# Patient Record
Sex: Male | Born: 1950 | Race: White | Hispanic: No | Marital: Married | Smoking: Former smoker
Health system: Southern US, Community
[De-identification: ages and names within clinical notes are randomized; demographics above are authoritative.]

## PROBLEM LIST (undated history)

## (undated) DIAGNOSIS — K222 Esophageal obstruction: Secondary | ICD-10-CM

## (undated) DIAGNOSIS — E785 Hyperlipidemia, unspecified: Secondary | ICD-10-CM

## (undated) DIAGNOSIS — K219 Gastro-esophageal reflux disease without esophagitis: Secondary | ICD-10-CM

## (undated) DIAGNOSIS — F1010 Alcohol abuse, uncomplicated: Secondary | ICD-10-CM

## (undated) DIAGNOSIS — I2510 Atherosclerotic heart disease of native coronary artery without angina pectoris: Secondary | ICD-10-CM

## (undated) HISTORY — DX: Gastro-esophageal reflux disease without esophagitis: K21.9

## (undated) HISTORY — PX: SPLENECTOMY, PARTIAL: SHX787

## (undated) HISTORY — DX: Atherosclerotic heart disease of native coronary artery without angina pectoris: I25.10

## (undated) HISTORY — PX: LAMINECTOMY: SHX219

## (undated) HISTORY — PX: INGUINAL HERNIA REPAIR: SUR1180

## (undated) HISTORY — DX: Esophageal obstruction: K22.2

## (undated) HISTORY — DX: Hyperlipidemia, unspecified: E78.5

## (undated) HISTORY — PX: HEMORROIDECTOMY: SUR656

---

## 2008-11-16 HISTORY — PX: CORONARY ARTERY BYPASS GRAFT: SHX141

## 2008-11-25 ENCOUNTER — Encounter: Payer: Self-pay | Admitting: Cardiology

## 2008-11-26 ENCOUNTER — Ambulatory Visit: Payer: Self-pay | Admitting: Cardiovascular Disease

## 2008-11-26 ENCOUNTER — Ambulatory Visit: Payer: Self-pay | Admitting: Cardiology

## 2008-11-26 ENCOUNTER — Inpatient Hospital Stay (HOSPITAL_COMMUNITY): Admission: AD | Admit: 2008-11-26 | Discharge: 2008-12-05 | Payer: Self-pay | Admitting: Cardiology

## 2008-11-27 ENCOUNTER — Ambulatory Visit: Payer: Self-pay | Admitting: Cardiovascular Disease

## 2008-11-27 ENCOUNTER — Encounter: Payer: Self-pay | Admitting: Thoracic Surgery (Cardiothoracic Vascular Surgery)

## 2008-11-27 ENCOUNTER — Encounter: Payer: Self-pay | Admitting: Cardiovascular Disease

## 2008-11-28 ENCOUNTER — Ambulatory Visit: Payer: Self-pay | Admitting: Thoracic Surgery (Cardiothoracic Vascular Surgery)

## 2008-12-02 ENCOUNTER — Encounter: Payer: Self-pay | Admitting: Cardiology

## 2008-12-03 ENCOUNTER — Encounter: Payer: Self-pay | Admitting: Cardiology

## 2008-12-04 ENCOUNTER — Encounter: Payer: Self-pay | Admitting: Cardiology

## 2008-12-21 ENCOUNTER — Ambulatory Visit: Payer: Self-pay | Admitting: Cardiology

## 2008-12-21 ENCOUNTER — Encounter: Payer: Self-pay | Admitting: Physician Assistant

## 2008-12-25 ENCOUNTER — Encounter
Admission: RE | Admit: 2008-12-25 | Discharge: 2008-12-25 | Payer: Self-pay | Admitting: Thoracic Surgery (Cardiothoracic Vascular Surgery)

## 2008-12-25 ENCOUNTER — Ambulatory Visit: Payer: Self-pay | Admitting: Thoracic Surgery (Cardiothoracic Vascular Surgery)

## 2009-01-26 ENCOUNTER — Ambulatory Visit: Payer: Self-pay | Admitting: Thoracic Surgery (Cardiothoracic Vascular Surgery)

## 2009-02-08 ENCOUNTER — Ambulatory Visit: Payer: Self-pay | Admitting: Thoracic Surgery (Cardiothoracic Vascular Surgery)

## 2009-02-16 ENCOUNTER — Encounter: Payer: Self-pay | Admitting: Cardiology

## 2009-02-22 ENCOUNTER — Encounter: Payer: Self-pay | Admitting: Cardiology

## 2009-03-29 ENCOUNTER — Telehealth: Payer: Self-pay | Admitting: Cardiology

## 2009-04-20 ENCOUNTER — Encounter (INDEPENDENT_AMBULATORY_CARE_PROVIDER_SITE_OTHER): Payer: Self-pay | Admitting: *Deleted

## 2009-05-10 DIAGNOSIS — I4510 Unspecified right bundle-branch block: Secondary | ICD-10-CM

## 2009-05-10 DIAGNOSIS — I10 Essential (primary) hypertension: Secondary | ICD-10-CM | POA: Insufficient documentation

## 2009-05-10 DIAGNOSIS — I2510 Atherosclerotic heart disease of native coronary artery without angina pectoris: Secondary | ICD-10-CM

## 2009-05-12 ENCOUNTER — Ambulatory Visit: Payer: Self-pay | Admitting: Cardiology

## 2009-05-12 DIAGNOSIS — E782 Mixed hyperlipidemia: Secondary | ICD-10-CM | POA: Insufficient documentation

## 2009-07-27 ENCOUNTER — Encounter: Payer: Self-pay | Admitting: Cardiology

## 2009-12-18 IMAGING — CR DG CHEST 2V
2 series · 2 of 2 positions shown · non-contrast
Comparison: None

CLINICAL DATA: Myocardial infarction

CHEST - 2 VIEW

[w chest pa]
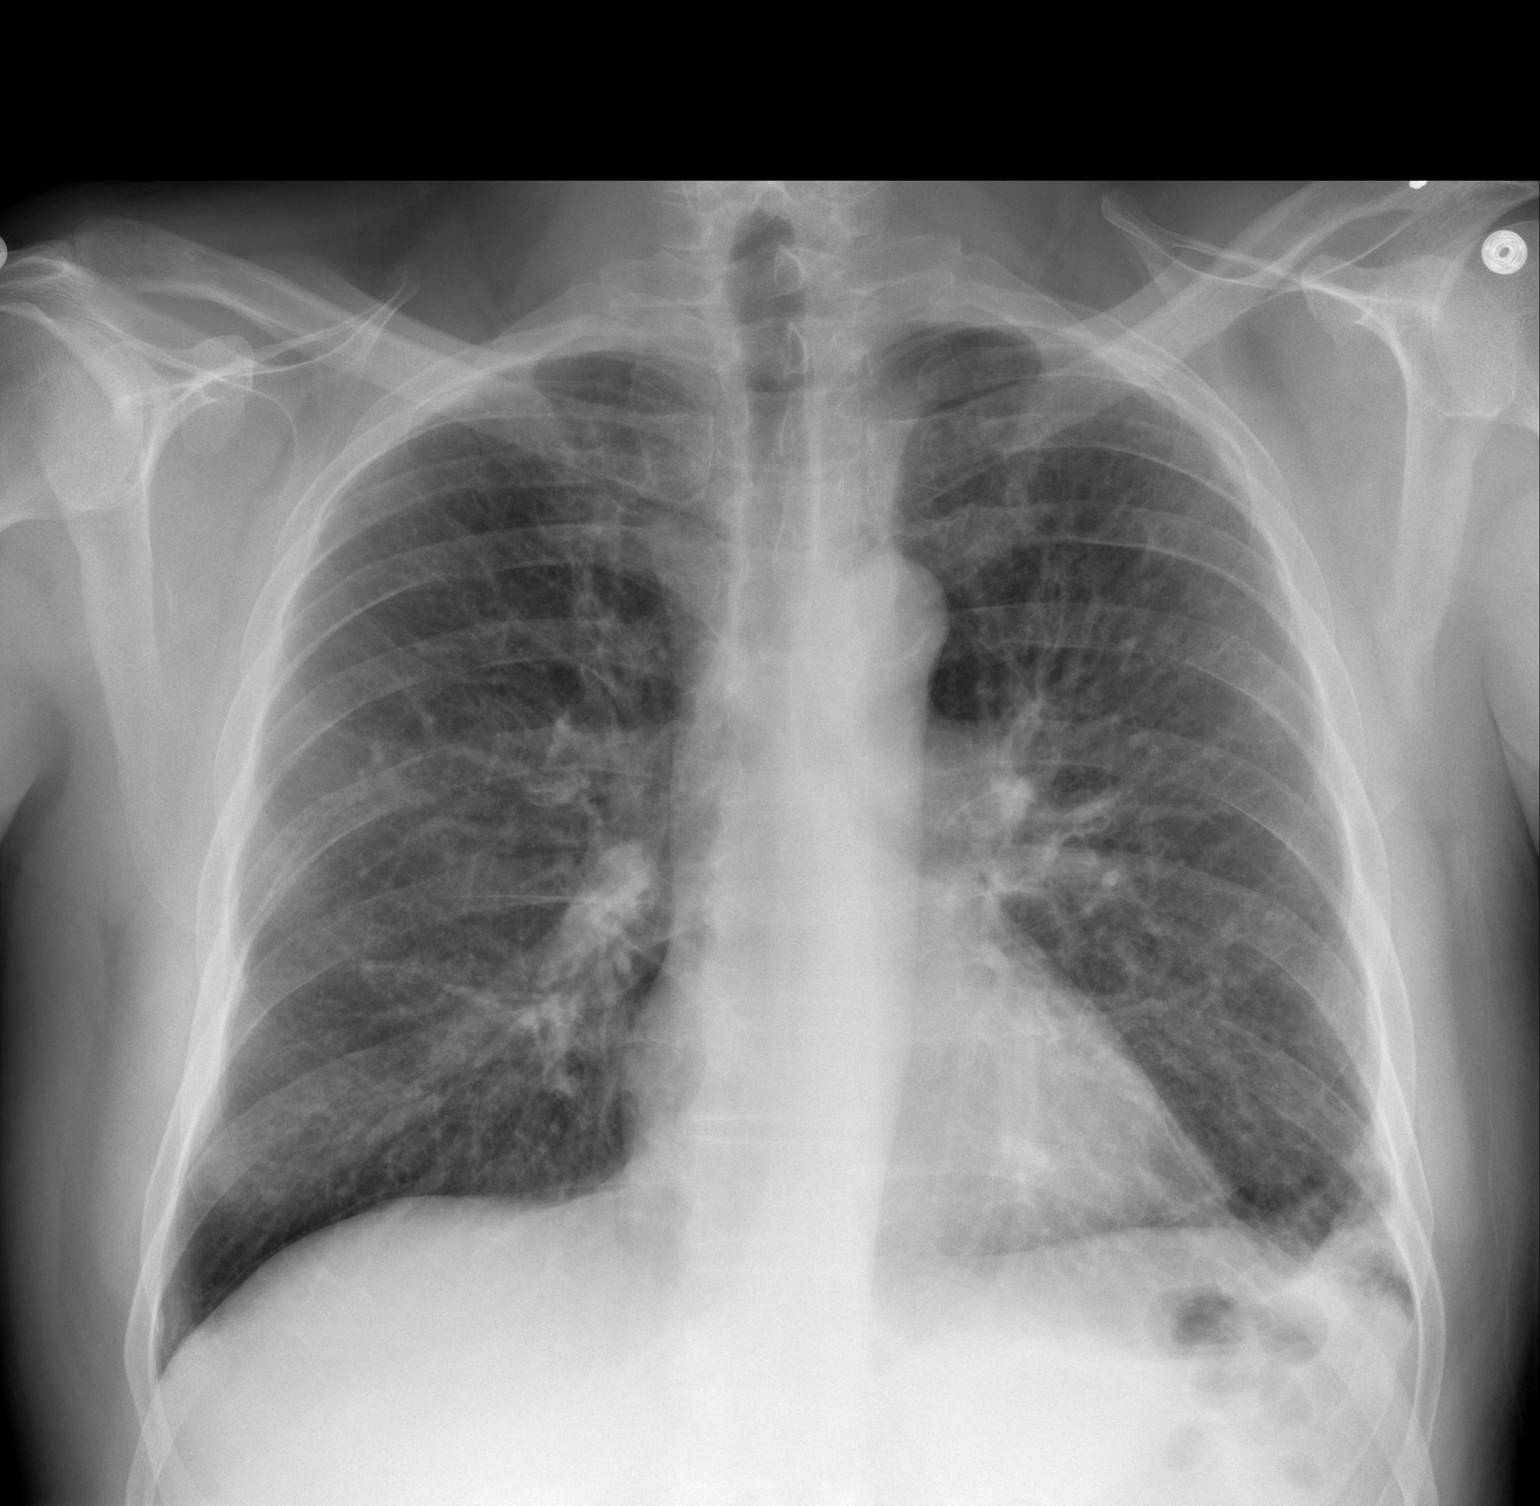

[w chest lat]
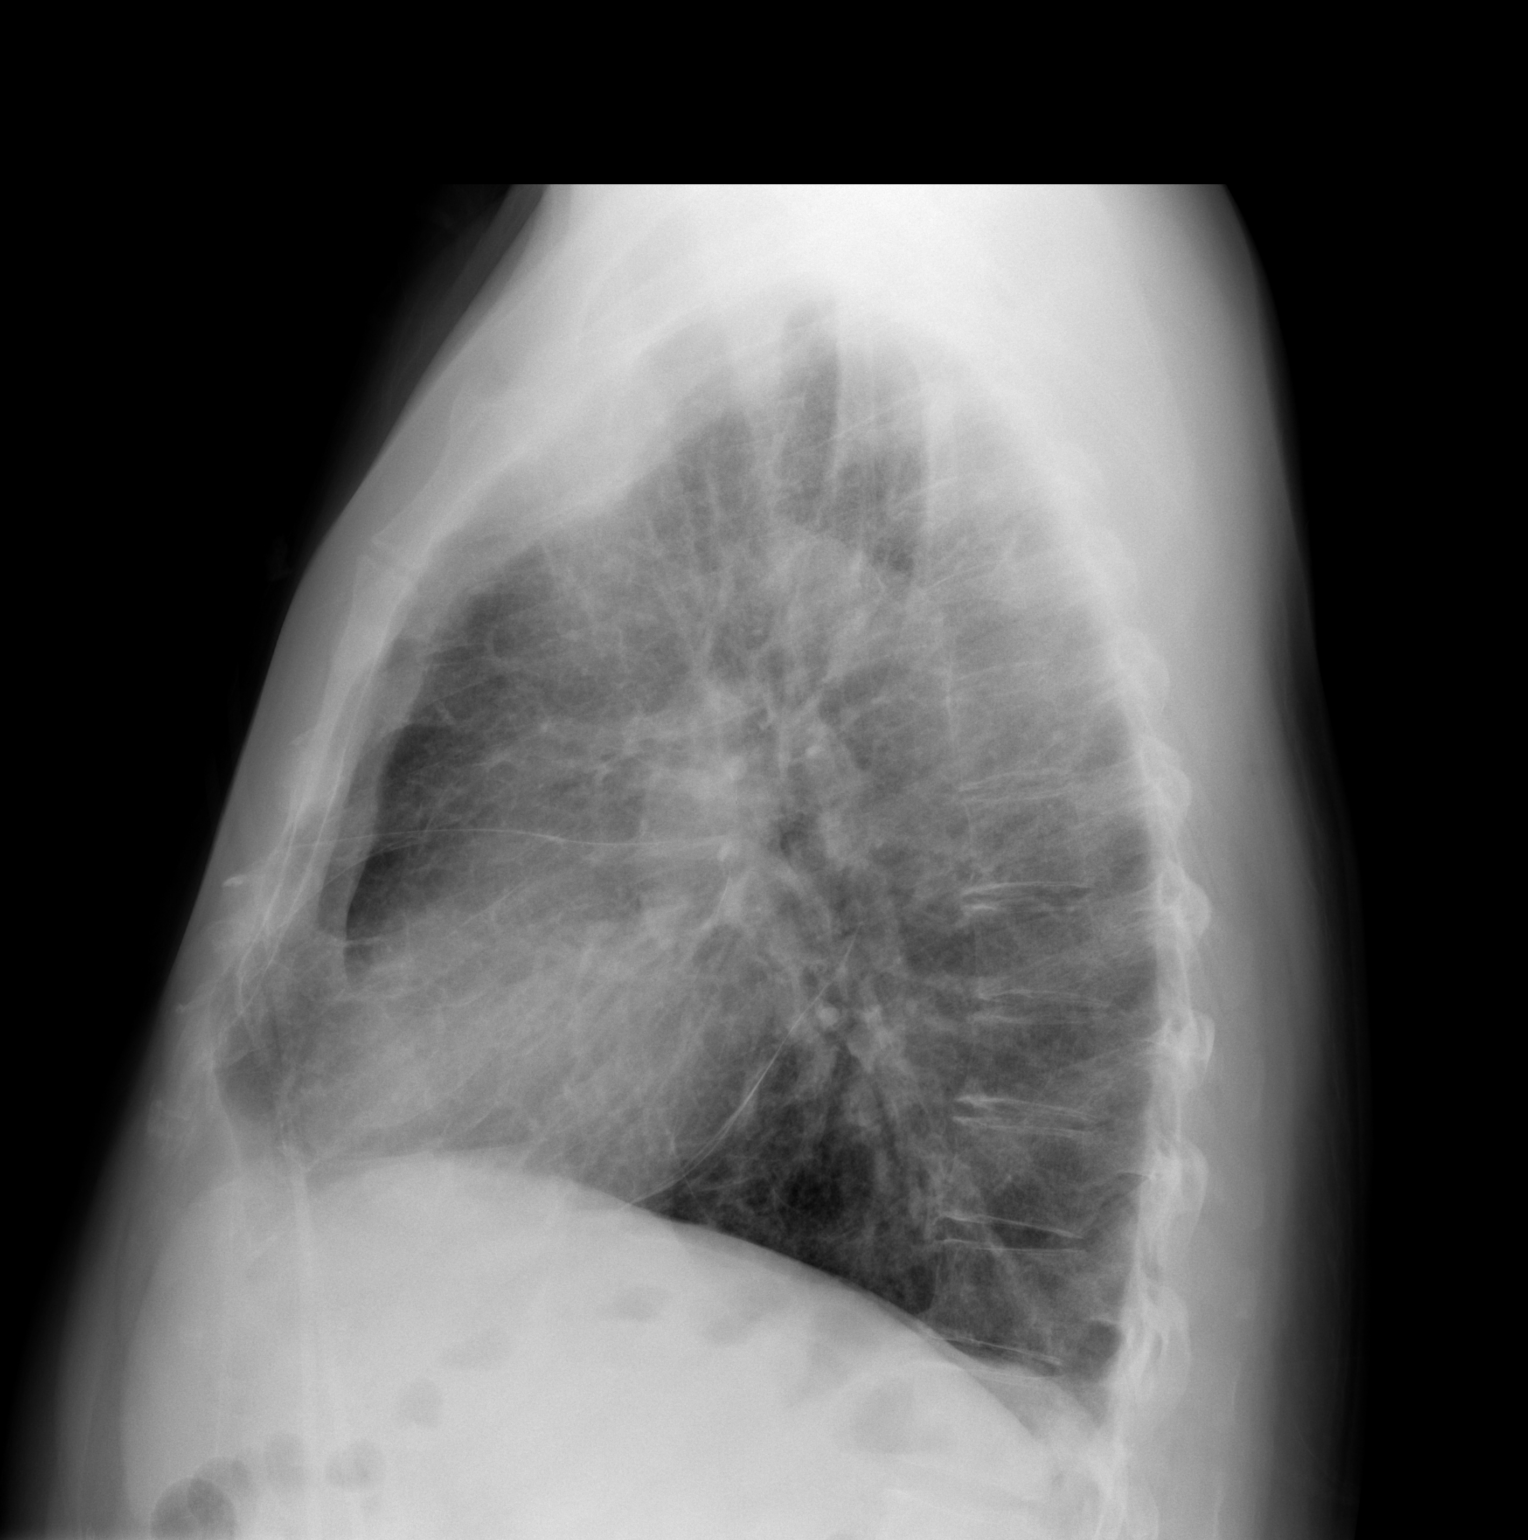

[2 of 2 positions shown; findings below may reference images not displayed]

FINDINGS: Minimal patchy atelectasis verses airspace disease at the
left base laterally.  No Kerley B lines.  Pulmonary vascularity is
within normal limits.  Bronchitic changes of chronic appearance.
The cardiomediastinal silhouette is within normal limits.  No
pneumothorax.
IMPRESSION: Minimal patchy atelectasis versus airspace disease at the left base
laterally.

## 2010-03-18 ENCOUNTER — Encounter (INDEPENDENT_AMBULATORY_CARE_PROVIDER_SITE_OTHER): Payer: Self-pay | Admitting: *Deleted

## 2010-03-18 ENCOUNTER — Encounter: Payer: Self-pay | Admitting: Cardiology

## 2010-03-28 ENCOUNTER — Ambulatory Visit: Payer: Self-pay | Admitting: Cardiology

## 2010-03-28 DIAGNOSIS — K439 Ventral hernia without obstruction or gangrene: Secondary | ICD-10-CM | POA: Insufficient documentation

## 2010-03-28 DIAGNOSIS — R609 Edema, unspecified: Secondary | ICD-10-CM | POA: Insufficient documentation

## 2010-04-12 ENCOUNTER — Ambulatory Visit: Payer: Self-pay | Admitting: Thoracic Surgery (Cardiothoracic Vascular Surgery)

## 2010-04-12 ENCOUNTER — Encounter: Payer: Self-pay | Admitting: Cardiology

## 2010-04-18 ENCOUNTER — Encounter: Payer: Self-pay | Admitting: Cardiology

## 2010-04-25 ENCOUNTER — Encounter: Payer: Self-pay | Admitting: Cardiology

## 2010-05-02 ENCOUNTER — Telehealth (INDEPENDENT_AMBULATORY_CARE_PROVIDER_SITE_OTHER): Payer: Self-pay | Admitting: *Deleted

## 2010-08-26 ENCOUNTER — Encounter: Payer: Self-pay | Admitting: Cardiology

## 2010-10-06 ENCOUNTER — Encounter: Payer: Self-pay | Admitting: Cardiology

## 2010-10-07 ENCOUNTER — Encounter: Payer: Self-pay | Admitting: Cardiology

## 2010-10-09 ENCOUNTER — Encounter: Payer: Self-pay | Admitting: Thoracic Surgery (Cardiothoracic Vascular Surgery)

## 2010-10-12 ENCOUNTER — Telehealth (INDEPENDENT_AMBULATORY_CARE_PROVIDER_SITE_OTHER): Payer: Self-pay | Admitting: *Deleted
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
## 2010-10-18 NOTE — Progress Notes (Signed)
Summary: Pt Call  Phone Note Call from Patient Call back at Home Phone [phone]   Summary of Call: Pt left message on voicemail asking for return call regarding Lipid labs.    Left message to call back on machine. Initial call taken by: Cyril Loosen, RN, BSN,  May 02, 2010 12:36 PM  Follow-up for Phone Call        Left message to call back on machine. Cyril Loosen, RN, BSN  May 05, 2010 2:08 PM  Left message asking pt to return call if he still has question regarding lipid labs. Follow-up by: Cyril Loosen, RN, BSN,  May 10, 2010 4:18 PM

## 2010-10-18 NOTE — Letter (Signed)
Summary: [facility]   [facility]   Imported By: Roderic Ovens 04/27/2010 14:59:40  _____________________________________________________________________  External Attachment:    Type:   Image     Comment:   External Document

## 2010-10-18 NOTE — Miscellaneous (Signed)
Summary: Orders Update  Clinical Lists Changes  Orders: Added new Test order of T-Lipid Profile ([id number]) - Signed Added new Test order of T-Hepatic Function ([id number]) - Signed 

## 2010-10-20 NOTE — Progress Notes (Signed)
Summary: Pt Requesting Order for Labs  ---- Converted from flag ---- ---- 10/06/2010 8:59 AM, Zachary George wrote: Has appt on 11-16-2010 with Dr. Reita Chard lab orders and please mail a copy of order to patient ------------------------------  Left message for pt to call back on voicemail regarding what lab work pt feels needs to be done. He had abn liver function labs and was asked to see his GI MD (Dr. Aleene Davidson) for further evaluation.   Appended Document: Pt Requesting Order for Labs Pt states he saw his GI MD who restarted Crestor after liver function labs came down. Pt asked to bring copy of labs and last office note from Dr. Vangie Bicker office. Pt verbalized understanding.

## 2010-11-16 ENCOUNTER — Ambulatory Visit (INDEPENDENT_AMBULATORY_CARE_PROVIDER_SITE_OTHER): Payer: BC Managed Care – PPO | Admitting: Cardiology

## 2010-11-16 ENCOUNTER — Encounter: Payer: Self-pay | Admitting: Cardiology

## 2010-11-16 DIAGNOSIS — E782 Mixed hyperlipidemia: Secondary | ICD-10-CM

## 2010-11-16 DIAGNOSIS — J984 Other disorders of lung: Secondary | ICD-10-CM | POA: Insufficient documentation

## 2010-11-16 DIAGNOSIS — R9389 Abnormal findings on diagnostic imaging of other specified body structures: Secondary | ICD-10-CM

## 2010-11-16 DIAGNOSIS — I2510 Atherosclerotic heart disease of native coronary artery without angina pectoris: Secondary | ICD-10-CM

## 2010-11-16 DIAGNOSIS — R609 Edema, unspecified: Secondary | ICD-10-CM
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
## 2010-11-28 ENCOUNTER — Encounter: Payer: Self-pay | Admitting: Cardiology

## 2010-12-06 ENCOUNTER — Telehealth: Payer: Self-pay | Admitting: *Deleted

## 2010-12-06 ENCOUNTER — Encounter: Payer: Self-pay | Admitting: *Deleted

## 2010-12-06 DIAGNOSIS — E782 Mixed hyperlipidemia: Secondary | ICD-10-CM

## 2010-12-06 DIAGNOSIS — Z79899 Other long term (current) drug therapy: Secondary | ICD-10-CM

## 2010-12-06 MED ORDER — ROSUVASTATIN CALCIUM 5 MG PO TABS: 5.0000 mg | ORAL_TABLET | Freq: Every day | ORAL | Status: DC

## 2010-12-06 NOTE — Telephone Encounter (Signed)
Pt's wife returned call in reference to pt's lab results. 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 He wanted her to call us for results. Pt's wife notified of results to decrease Crestor to 5mg  every night (start new prescription) and repeat cholesterol labs in 8 weeks. Pt's wife verbalized understanding.

## 2010-12-29 LAB — GLUCOSE, CAPILLARY
Glucose-Capillary: 101 mg/dL — ABNORMAL HIGH (ref 70–99)
Glucose-Capillary: 106 mg/dL — ABNORMAL HIGH (ref 70–99)
Glucose-Capillary: 112 mg/dL — ABNORMAL HIGH (ref 70–99)
Glucose-Capillary: 119 mg/dL — ABNORMAL HIGH (ref 70–99)
Glucose-Capillary: 122 mg/dL — ABNORMAL HIGH (ref 70–99)
Glucose-Capillary: 134 mg/dL — ABNORMAL HIGH (ref 70–99)
Glucose-Capillary: 135 mg/dL — ABNORMAL HIGH (ref 70–99)
Glucose-Capillary: 153 mg/dL — ABNORMAL HIGH (ref 70–99)
Glucose-Capillary: 69 mg/dL — ABNORMAL LOW (ref 70–99)
Glucose-Capillary: 88 mg/dL (ref 70–99)

## 2010-12-29 LAB — POCT I-STAT, CHEM 8
BUN: 4 mg/dL — ABNORMAL LOW (ref 6–23)
Creatinine, Ser: 0.7 mg/dL (ref 0.4–1.5)
Glucose, Bld: 110 mg/dL — ABNORMAL HIGH (ref 70–99)
Hemoglobin: 11.6 g/dL — ABNORMAL LOW (ref 13.0–17.0)
Potassium: 4.4 mEq/L (ref 3.5–5.1)
Sodium: 139 mEq/L (ref 135–145)

## 2010-12-29 LAB — CBC
HCT: 33.7 % — ABNORMAL LOW (ref 39.0–52.0)
HCT: 38.2 % — ABNORMAL LOW (ref 39.0–52.0)
HCT: 43.4 % (ref 39.0–52.0)
HCT: 44 % (ref 39.0–52.0)
HCT: 44.2 % (ref 39.0–52.0)
Hemoglobin: 12.3 g/dL — ABNORMAL LOW (ref 13.0–17.0)
Hemoglobin: 14.8 g/dL (ref 13.0–17.0)
Hemoglobin: 15.2 g/dL (ref 13.0–17.0)
Hemoglobin: 16 g/dL (ref 13.0–17.0)
MCHC: 34 g/dL (ref 30.0–36.0)
MCHC: 35.1 g/dL (ref 30.0–36.0)
MCHC: 34.6 g/dL (ref 30.0–36.0)
MCV: 101.1 fL — ABNORMAL HIGH (ref 78.0–100.0)
MCV: 101.2 fL — ABNORMAL HIGH (ref 78.0–100.0)
MCV: 99.7 fL (ref 78.0–100.0)
MCV: 100.6 fL — ABNORMAL HIGH (ref 78.0–100.0)
MCV: 101 fL — ABNORMAL HIGH (ref 78.0–100.0)
MCV: 101.5 fL — ABNORMAL HIGH (ref 78.0–100.0)
MCV: 99.4 fL (ref 78.0–100.0)
Platelets: 138 10*3/uL — ABNORMAL LOW (ref 150–400)
Platelets: 141 10*3/uL — ABNORMAL LOW (ref 150–400)
RBC: 3.37 MIL/uL — ABNORMAL LOW (ref 4.22–5.81)
RBC: 3.78 MIL/uL — ABNORMAL LOW (ref 4.22–5.81)
RBC: 4.24 MIL/uL (ref 4.22–5.81)
RBC: 4.31 MIL/uL (ref 4.22–5.81)
RBC: 4.35 MIL/uL (ref 4.22–5.81)
RBC: 4.45 MIL/uL (ref 4.22–5.81)
RBC: 4.58 MIL/uL (ref 4.22–5.81)
RDW: 13.5 % (ref 11.5–15.5)
RDW: 13.7 % (ref 11.5–15.5)
RDW: 13.9 % (ref 11.5–15.5)
RDW: 13.4 % (ref 11.5–15.5)
WBC: 10.4 10*3/uL (ref 4.0–10.5)
WBC: 16.5 10*3/uL — ABNORMAL HIGH (ref 4.0–10.5)
WBC: 19.9 10*3/uL — ABNORMAL HIGH (ref 4.0–10.5)
WBC: 11.5 10*3/uL — ABNORMAL HIGH (ref 4.0–10.5)
WBC: 11.9 10*3/uL — ABNORMAL HIGH (ref 4.0–10.5)
WBC: 14.6 10*3/uL — ABNORMAL HIGH (ref 4.0–10.5)

## 2010-12-29 LAB — POCT I-STAT 4, (NA,K, GLUC, HGB,HCT)
Glucose, Bld: 114 mg/dL — ABNORMAL HIGH (ref 70–99)
Glucose, Bld: 120 mg/dL — ABNORMAL HIGH (ref 70–99)
HCT: 31 % — ABNORMAL LOW (ref 39.0–52.0)
HCT: 33 % — ABNORMAL LOW (ref 39.0–52.0)
HCT: 45 % (ref 39.0–52.0)
Hemoglobin: 11.2 g/dL — ABNORMAL LOW (ref 13.0–17.0)
Hemoglobin: 12.9 g/dL — ABNORMAL LOW (ref 13.0–17.0)
Hemoglobin: 15.3 g/dL (ref 13.0–17.0)
Potassium: 3.9 mEq/L (ref 3.5–5.1)
Potassium: 5.3 mEq/L — ABNORMAL HIGH (ref 3.5–5.1)
Sodium: 131 mEq/L — ABNORMAL LOW (ref 135–145)
Sodium: 139 mEq/L (ref 135–145)
Sodium: 139 mEq/L (ref 135–145)

## 2010-12-29 LAB — COMPREHENSIVE METABOLIC PANEL
ALT: 24 U/L (ref 0–53)
Alkaline Phosphatase: 50 U/L (ref 39–117)
BUN: 6 mg/dL (ref 6–23)
CO2: 24 mEq/L (ref 19–32)
Chloride: 106 mEq/L (ref 96–112)
Glucose, Bld: 103 mg/dL — ABNORMAL HIGH (ref 70–99)
Potassium: 3.8 mEq/L (ref 3.5–5.1)
Sodium: 135 mEq/L (ref 135–145)
Total Bilirubin: 0.8 mg/dL (ref 0.3–1.2)
Total Protein: 6.3 g/dL (ref 6.0–8.3)

## 2010-12-29 LAB — POCT I-STAT 3, ART BLOOD GAS (G3+)
Acid-base deficit: 1 mmol/L (ref 0.0–2.0)
Acid-base deficit: 5 mmol/L — ABNORMAL HIGH (ref 0.0–2.0)
Bicarbonate: 23.8 mEq/L (ref 20.0–24.0)
Bicarbonate: 25.1 mEq/L — ABNORMAL HIGH (ref 20.0–24.0)
Bicarbonate: 26 mEq/L — ABNORMAL HIGH (ref 20.0–24.0)
O2 Saturation: 100 %
O2 Saturation: 100 %
O2 Saturation: 93 %
Patient temperature: 36.5
TCO2: 26 mmol/L (ref 0–100)
TCO2: 26 mmol/L (ref 0–100)
TCO2: 28 mmol/L (ref 0–100)
pCO2 arterial: 41.1 mmHg (ref 35.0–45.0)
pCO2 arterial: 44 mmHg (ref 35.0–45.0)
pCO2 arterial: 45.9 mmHg — ABNORMAL HIGH (ref 35.0–45.0)
pH, Arterial: 7.361 (ref 7.350–7.450)
pH, Arterial: 7.383 (ref 7.350–7.450)
pH, Arterial: 7.448 (ref 7.350–7.450)
pO2, Arterial: 211 mmHg — ABNORMAL HIGH (ref 80.0–100.0)
pO2, Arterial: 326 mmHg — ABNORMAL HIGH (ref 80.0–100.0)
pO2, Arterial: 61 mmHg — ABNORMAL LOW (ref 80.0–100.0)

## 2010-12-29 LAB — MAGNESIUM: Magnesium: 2.3 mg/dL (ref 1.5–2.5)

## 2010-12-29 LAB — URINALYSIS, ROUTINE W REFLEX MICROSCOPIC
Bilirubin Urine: NEGATIVE
Hgb urine dipstick: NEGATIVE
Ketones, ur: NEGATIVE mg/dL
Specific Gravity, Urine: 1.008 (ref 1.005–1.030)
Urobilinogen, UA: 0.2 mg/dL (ref 0.0–1.0)
pH: 6.5 (ref 5.0–8.0)

## 2010-12-29 LAB — BLOOD GAS, ARTERIAL
Acid-base deficit: 1.7 mmol/L (ref 0.0–2.0)
Bicarbonate: 21.9 mEq/L (ref 20.0–24.0)
O2 Saturation: 92.9 %
Patient temperature: 98.6
TCO2: 22.9 mmol/L (ref 0–100)
pO2, Arterial: 66.5 mmHg — ABNORMAL LOW (ref 80.0–100.0)

## 2010-12-29 LAB — BASIC METABOLIC PANEL
BUN: 7 mg/dL (ref 6–23)
BUN: 8 mg/dL (ref 6–23)
BUN: 6 mg/dL (ref 6–23)
BUN: 6 mg/dL (ref 6–23)
CO2: 21 mEq/L (ref 19–32)
CO2: 24 mEq/L (ref 19–32)
Calcium: 8.7 mg/dL (ref 8.4–10.5)
Calcium: 8.8 mg/dL (ref 8.4–10.5)
Calcium: 8.8 mg/dL (ref 8.4–10.5)
Calcium: 8.9 mg/dL (ref 8.4–10.5)
Chloride: 111 mEq/L (ref 96–112)
Chloride: 105 mEq/L (ref 96–112)
Chloride: 106 mEq/L (ref 96–112)
Creatinine, Ser: 0.8 mg/dL (ref 0.4–1.5)
Creatinine, Ser: 0.88 mg/dL (ref 0.4–1.5)
Creatinine, Ser: 0.82 mg/dL (ref 0.4–1.5)
GFR calc Af Amer: >60 mL/min (ref >60)
GFR calc Af Amer: >60 mL/min (ref >60)
GFR calc non Af Amer: >60 mL/min (ref >60)
GFR calc non Af Amer: >60 mL/min (ref >60)
GFR calc non Af Amer: >60 mL/min (ref >60)
Glucose, Bld: 102 mg/dL — ABNORMAL HIGH (ref 70–99)
Glucose, Bld: 91 mg/dL (ref 70–99)
Glucose, Bld: 95 mg/dL (ref 70–99)
Potassium: 3.5 mEq/L (ref 3.5–5.1)
Potassium: 4.8 mEq/L (ref 3.5–5.1)
Sodium: 135 mEq/L (ref 135–145)
Sodium: 138 mEq/L (ref 135–145)

## 2010-12-29 LAB — CARDIAC PANEL(CRET KIN+CKTOT+MB+TROPI)
Relative Index: 3.5 — ABNORMAL HIGH (ref 0.0–2.5)
Relative Index: 3.9 — ABNORMAL HIGH (ref 0.0–2.5)
Total CK: 204 U/L (ref 7–232)
Troponin I: 0.78 ng/mL (ref 0.00–0.06)
Troponin I: 0.94 ng/mL (ref 0.00–0.06)

## 2010-12-29 LAB — POCT I-STAT GLUCOSE: Glucose, Bld: 116 mg/dL — ABNORMAL HIGH (ref 70–99)

## 2010-12-29 LAB — POCT I-STAT 3, VENOUS BLOOD GAS (G3P V)
O2 Saturation: 88 %
TCO2: 24 mmol/L (ref 0–100)

## 2010-12-29 LAB — HEPARIN LEVEL (UNFRACTIONATED): Heparin Unfractionated: 0.21 IU/mL — ABNORMAL LOW (ref 0.30–0.70)

## 2010-12-29 LAB — PROTIME-INR
Prothrombin Time: 16.6 seconds — ABNORMAL HIGH (ref 11.6–15.2)
Prothrombin Time: 13.8 seconds (ref 11.6–15.2)

## 2010-12-29 LAB — HEMOGLOBIN AND HEMATOCRIT, BLOOD
HCT: 32.2 % — ABNORMAL LOW (ref 39.0–52.0)
Hemoglobin: 11.2 g/dL — ABNORMAL LOW (ref 13.0–17.0)

## 2010-12-29 LAB — DIFFERENTIAL
Lymphs Abs: 3.3 10*3/uL (ref 0.7–4.0)
Monocytes Absolute: 1.3 10*3/uL — ABNORMAL HIGH (ref 0.1–1.0)
Monocytes Relative: 11 % (ref 3–12)
Neutro Abs: 7 10*3/uL (ref 1.7–7.7)
Neutrophils Relative %: 59 % (ref 43–77)

## 2010-12-29 LAB — APTT: aPTT: 38 seconds — ABNORMAL HIGH (ref 24–37)

## 2010-12-29 LAB — CREATININE, SERUM
Creatinine, Ser: 0.71 mg/dL (ref 0.4–1.5)
GFR calc Af Amer: >60 mL/min (ref >60)

## 2010-12-29 LAB — TSH: TSH: 1.127 u[IU]/mL (ref 0.350–4.500)

## 2010-12-29 LAB — TYPE AND SCREEN: Antibody Screen: NEGATIVE

## 2011-01-31 ENCOUNTER — Telehealth: Payer: Self-pay | Admitting: *Deleted

## 2011-01-31 ENCOUNTER — Encounter: Payer: Self-pay | Admitting: Cardiology

## 2011-01-31 NOTE — Telephone Encounter (Signed)
Message copied by Cyril Loosen on Tue Jan 31, 2011  3:54 PM ------      Message from: Nona Dell      Created: Tue Jan 31, 2011 12:36 PM       AST and ALT are essentially high normal, better than last checked. LDL is reasonable at 85. Will try to continue Crestor 5 mg daily for now. Still should be careful about alcohol intake.

## 2011-01-31 NOTE — Assessment & Plan Note (Signed)
OFFICE VISIT   [name], [name]  DOB:  07/28/1951                                        December 25, 2008  CHART #:  [id number]   The patient is a 60 year old gentleman, who underwent coronary artery  bypass grafting x6.  We used a left mammary and a left radial as well as  some saphenous vein on December 01, 2008.  Postoperatively did well,  discharged home without any complications.  He has continued to do well.  He is not taking any pain medications.  His exercise tolerance is good.  He is anxious to increase his physical activity and he is also anxious  to start cardiac rehab.   PHYSICAL EXAMINATION:  GENERAL:  The patient is a well-appearing 57-year-  old white male in no acute distress.  VITAL SIGNS:  His blood pressure is 116/76, pulse 78, respirations are  20, and oxygen saturation is 97% on room air.  LUNGS:  Clear with equal breath sounds bilaterally.  CARDIAC:  Regular rate and rhythm.  Normal S1 and S2.  No murmurs, rubs,  or gallops.  CHEST:  His sternum is stable.  Sternal incision is well healed.  EXTREMITIES:  He has no peripheral edema.  His arm incision has some  minimal eschar, but no signs of infection.  He did have an infection and  has resolving cellulitis at his right ankle incision.   Chest x-ray shows good aeration of the lungs bilaterally.  There is no  effusions or infiltrates.   IMPRESSION:  The patient is a 60 year old gentleman, who is doing  extremely well after coronary artery bypass grafting.  He did have some  cellulitis of his right leg incision at the ankle.  He was treated with  antibiotics with Keflex by Dr. Diona Browner and that is resolving.  Otherwise, he has had no complications.  He is doing quite well.  His  exercise tolerance is good.  He has minimal discomfort.  I encouraged  him to do the cardiac rehab program which he is planning to do.  He is  not to lift any heavy objects or mow his yard for another 3 weeks.  He  may begin driving on a limited basis.  Appropriate precautions were  discussed.  We will set a tentative return to work date for March 22, 2009, for the patient.  He will continue to be followed by Dr. Diona Browner  and Dr. Doyne Keel.  I would be happy to see him back anytime if I can be  of any further assistance with his care.   Salvatore Decent Dorris Fetch, M.D.  Electronically Signed   SCH/MEDQ  D:  12/25/2008  T:  12/26/2008  Job:  [id number]   cc:   Jonelle Sidle, MD  Wende Crease, MD

## 2011-01-31 NOTE — Telephone Encounter (Signed)
Left message to call back on voicemail regarding lab results. 
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
## 2011-01-31 NOTE — Assessment & Plan Note (Signed)
[facility]                          [facility] OFFICE NOTE   NAME:Brester, JHAN CONERY                      MRN:          [id number]  DATE:12/21/2008                            DOB:          Nov 10, 1950    PRIMARY CARDIOLOGIST:  Jonelle Sidle, MD   REASON FOR VISIT:  Post-hospital followup.   Mr. [name] presents to our clinic for his initial visit, following  recent coronary artery bypass grafting at [facility].  The  patient was referred to [place] initially here at [facility] on November 26, 2008, when he presented with non-ST-elevation myocardial infarction.  He had no prior history of heart disease, but several cardiac risk  factors, and presented with abnormal cardiac markers with a troponin of  1.2.  No acute changes were noted on electrocardiogram, which was  notable for a right bundle-branch block, but with no old EKGs for  comparison.   Prior to transfer, the patient was treated with a 300 mg dose of Plavix.   Cardiac catheterization revealed severe native coronary artery disease,  including 80% distal stenosis of the left main coronary artery.  Left  ventricular function was preserved.  He was referred for surgical  revascularization, which was successfully performed, by Dr. Charlett Lango, on December 01, 2008, with grafting of the LIMA-LAD;  left  renal artery-OM2-OM3; SVG-GX1; SVG PD-PL.   No significant postop developments were noted, particularly for  dysrhythmia.   Clinically, Mr. Shakespeare has done quite well since undergoing surgery.  He  is ambulating twice daily, with no symptoms suggestive of unstable  angina pectoris.  He has quit smoking tobacco.  He is tolerating his  medications.  His wife is concerned, however, of possible infection of  the right lower extremity incision, indicating that there were some  pus appearing lesions in the recent past.   Of note, the patient is also anxious to return to work,  as well as  resume driving.   EKG today indicates NSR at 90 bpm with chronic right bundle-branch  block.   CURRENT MEDICATIONS:  1. Full-dose aspirin.  2. Lopressor 25 b.i.d.  3. Isosorbide mononitrate 15 daily.  4. Crestor 40 daily.   PHYSICAL EXAMINATION:  VITAL SIGNS:  Blood pressure 115/75, pulse 92,  regular, and weight 118.  General:  A 60 year old male sitting upright in no distress.  NECK:  No JVD.  LUNGS:  Clear to auscultation in all fields.  HEART:  Regular rate and rhythm.  No significant murmurs.  No rubs.  ABDOMEN:  Soft, nontender.  EXTREMITIES:  Trace edema with mild peri-incisional erythema in the  distal right lower leg.  The incision itself is closed, and there is no  obvious drainage.  There were also no obvious pus-like lesions.  NEUROLOGIC:  No focal deficit.   IMPRESSION:  1. Multivessel coronary artery disease.      a.     Status post non-ST segment elevation myocardial infarction.      b.     Status post Six-vessel coronary artery bypass grafting,       including  left radial artery graft.      c.     Preserved left ventricular function.  2. Hypertension.  3. Tobacco, since discontinued.  4. Chronic right bundle-branch block.  5. Question right lower extremity wound infection.   PLAN:  1. Adjust medications with up titration of Lopressor to 50 b.i.d., for      better control of elevated basal heart rate.  Down titrate aspirin      to 81 mg daily.  2. Reassess lipid status with a followup fasting lipid/liver profile      in approximately 8 weeks.  Recent data indicated dyslipidemia with      an LDL of 113, HDL of 23, total cholesterol of 159, and      triglycerides of 117.  3. Prescribed Keflex 250 mg p.o. q.i.d. (x10 days), for treatment of      possible infection of the distal right lower extremity incision      site.  Further recommendations will be deferred to Dr. Charlett Lango, with whom the patient is scheduled to follow at the       end of this week.  4. Schedule return clinic follow up with myself and Dr. Diona Browner in      approximately 3 months.  The patient is encouraged to participate      in cardiac rehab, once cleared from a surgical standpoint.      Rozell Searing, PA-C  Electronically Signed      Jonelle Sidle, MD  Electronically Signed   GS/MedQ  DD: 12/21/2008  DT: 12/22/2008  Job #: [id number]   cc:   Myrlene Broker, MD in [place], [place].

## 2011-01-31 NOTE — Assessment & Plan Note (Signed)
OFFICE VISIT   ASHAWN, RINEHART  DOB:  1951/02/01                                        April 12, 2010  CHART #:  [id number]   HISTORY:  The patient is a 60 year old gentleman who had a coronary  bypass grafting in March 2010.  We did 6 bypasses on him using a left  mammary, left radial as well as saphenous vein.  He did well  postoperatively.  He has continued to do well from a cardiac standpoint.  About 2 months ago, he noticed a bulge, right at the lower portion of  his sternal incision.  He said that this had not been there previously.  He noticed that it pokes out at times, but is soft.  He has never had a  sensation that there was a hard mass within it and that would not  reduce.   PAST MEDICAL HISTORY:  Significant for coronary artery disease status  post coronary artery bypass grafting, esophageal strictures requiring  dilatation, hypertension, gastroesophageal reflux, chronic constipation,  ruptured spleen, remote tobacco abuse, previous hemorrhoidectomy,  bilateral inguinal hernia repair, and lumbar laminectomy.   CURRENT MEDICATIONS:  1. Aspirin 325 mg daily.  2. Lopressor 50 mg b.i.d.  3. Lasix 20 mg daily.  4. Potassium 10 mEq daily.  5. He had been on Crestor, but that was discontinued.   ALLERGIES:  He has no known drug allergies.   FAMILY HISTORY:  Noncontributory.   SOCIAL HISTORY:  He quit smoking.  He still works with [employer] and do some lifting.   PHYSICAL EXAMINATION:  General:  The patient is a 60 year old gentleman,  in no acute distress.  Vital Signs:  Blood pressure is 137/92, pulse 80,  respirations are 18, and oxygen saturation is 93% on room air.  Lungs:  His sternum is stable.  Skin:  His skin incision is well healed  throughout.  At the lower portion of the incision, there is a small  hernia.  The margins of it are not particularly distinct.  The xiphoid  is easily palpable through that area.  There  is no evidence of  incarceration.   IMPRESSION:  The patient is a 60 year old gentleman.  He is about a year  and half out from coronary bypass grafting.  He has a small incisional  hernia at the inferior aspect of his sternal incision.  This is  minimally symptomatic and he was just more concerned that it could lead  to bigger problems down the road.  I discussed with him our options  would be to continue to follow this versus a surgical repair, which  would be straightforward procedure we done in day surgery type  situation, but he would be about 6 weeks without being able to do any  heavy lifting.  The patient does not wish to have any surgical  intervention at this time, but we will follow this.  He does know to  seek medical attention immediately if he were to develop any signs of  incarceration, I will discuss, in that area.  He also will call and set  up the time for repair if he notices more discomfort or any increase in  size in the area.  I will be happy to see him back at anytime.  If he  decides he wants  to go ahead and have the repair, I would be happy to do  so.   Salvatore Decent Dorris Fetch, M.D.  Electronically Signed   SCH/MEDQ  D:  04/12/2010  T:  04/13/2010  Job:  [id number]   cc:   Jonelle Sidle, MD  Dr. Wende Crease

## 2011-01-31 NOTE — Cardiovascular Report (Signed)
[name], BOATENG NO.:  [id number]   MEDICAL RECORD NO.:  [id number]          PATIENT TYPE:  INP   LOCATION:  2505                         FACILITY:  [facility]   PHYSICIAN:  Verne Carrow, MDDATE OF BIRTH:  Jan 01, 1951   DATE OF PROCEDURE:  11/26/2008  DATE OF DISCHARGE:                            CARDIAC CATHETERIZATION   PRIMARY CARDIOLOGIST:  Jonelle Sidle, MD   PROCEDURES PERFORMED:  1. Left heart catheterization.  2. Selective coronary angiography.  3. Left ventricular angiogram.   OPERATOR:  Verne Carrow, MD   INDICATIONS:  Non-ST-elevation myocardial infarction in a 60 year old  lifelong smoker with hypertension, hyperlipidemia, and a family history  of coronary artery disease.  The patient was transferred here from  [facility] this afternoon for a diagnostic left heart  catheterization.  He is currently chest pain free.   PROCEDURE IN DETAIL:  The patient was brought to the Inpatient Cardiac  Catheterization Laboratory after signing informed consent for the  procedure.  The right groin was prepped and draped in sterile fashion.  A 1% lidocaine was used for local anesthesia.  A 5-French sheath was  inserted into the right femoral artery without difficulty.  Standard  diagnostic catheters were used to perform selective coronary  angiography.  A 5-French pigtail catheter was used to perform a left  ventricular angiogram.  There was no significant pressure gradient  across the aortic valve during catheter pullback.  The patient tolerated  the procedure well and was taken to the holding area in stable  condition.   ANGIOGRAPHIC FINDINGS:  1. The left main coronary artery has a distal 70-80% stenosis.  2. Left anterior descending:  The left anterior descending artery has      a long segment of 50% stenosis.  There is a discrete 80% stenosis      in the mid LAD just beyond the second diagonal.  The distal LAD has      serial  40% stenoses.  First diagonal is a moderate-sized vessel      without severe stenosis.  The second diagonal is a small-to-      moderate-sized vessel without severe stenosis.  3. Circumflex:  The proximal circumflex has a 50% tubular stenosis.      There is a 60% stenosis in the midportion of this vessel just prior      to the takeoff of a large bifurcating third obtuse marginal branch.      The first and second obtuse marginal branches are small in caliber.      The third obtuse marginal branch has at least 50-60% stenosis in      the ostium.  There is diffuse disease down the remainder of this      large bifurcating third obtuse marginal branch.  4. Right coronary artery:  There is proximal 50% stenosis extending      from the ostium.  There is a 50% stenosis in the midportion of the      vessel.  There is a 99% stenosis in the distal portion of the      vessel.  The  posterior descending artery and the posterolateral      branch fill faintly from antegrade flow and also filled from left      collaterals.  5. Left ventricular angiogram performed in the RAO projection shows      mild hypokinesis of the inferior wall.  Overall, left ventricular      systolic function is well preserved.  There is no significant      mitral regurgitation noted.   HEMODYNAMIC DATA:  Central aortic pressure 112/68.  Left ventricular  pressure 101/11, end-diastolic pressure 21.   IMPRESSION:  1. Triple-vessel coronary artery disease.  2. Left main coronary artery disease.  3. Preserved left ventricular systolic function.   RECOMMENDATIONS:  After reviewing the patient's angiogram, I feel that  he would be best treated with a surgical revascularization.  We will  consult Cardiothoracic Surgery for opinion on surgical revascularization  in the morning.  This patient was loaded with 300 mg of Plavix earlier  this morning at [facility].  This would delay any surgical  revascularization for at least 4  days.  We will remove his sheath in the  holding area and will start a heparin drip 6 hours after the sheath was  removed.  We will continue his daily aspirin, beta-blocker, and statin  therapy.  His Plavix will be stopped pending surgical consultation.      Verne Carrow, MD  Electronically Signed     CM/MEDQ  D:  11/26/2008  T:  11/27/2008  Job:  [id number]

## 2011-01-31 NOTE — Op Note (Signed)
[name], Berger NO.:  [id number]   MEDICAL RECORD NO.:  [id number]          PATIENT TYPE:  INP   LOCATION:  2306                         FACILITY:  [facility]   PHYSICIAN:  Salvatore Decent. Dorris Fetch, M.D.DATE OF BIRTH:  March 18, 1951   DATE OF PROCEDURE:  12/01/2008  DATE OF DISCHARGE:                               OPERATIVE REPORT   PREOPERATIVE DIAGNOSIS:  Severe three-vessel coronary disease with non-Q  wave myocardial infarction.   POSTOPERATIVE DIAGNOSIS:  Severe three-vessel coronary disease with non-  Q wave myocardial infarction.   PROCEDURE:  Median sternotomy, extracorporeal circulation, coronary  artery bypass grafting x6 (left internal mammary artery to LAD,  sequential left radial artery to obtuse marginals 2 and 3, saphenous  vein graft to first diagonal, sequential saphenous vein graft to  posterior descending and posterolateral) open left radial harvest,  endoscopic harvest right thigh, open vein harvest right ankle.   SURGEON:  Salvatore Decent. Dorris Fetch, MD.   ASSISTANT:  Rowe Clack, PA-C.   SECOND ASSISTANT:  Coral Ceo, PA   ANESTHESIA:  General.   FINDINGS:  With the exception of LAD, small target vessels with good  quality conduits. Saphenous vein bifurcated in the lower thigh into  small unusable vessels.  Additional saphenous vein harvested in open  fashion from right ankle.   CLINICAL NOTE:  [name] is a 60 year old gentleman with multiple  cardiac risk factors who presented with unstable coronary syndrome and  enzymes were positive for a non-Q wave myocardial infarction.  At  cardiac catheterization, he had severe three-vessel coronary disease and  was referred for coronary artery bypass grafting.  He had been loaded  with Plavix.  Surgery was delayed to allow Plavix washout.  The patient  remained asymptomatic during that time.  The indications, risks,  benefits, and alternatives were discussed in detail with the patient.  The  risks had been previously outlined in the consultation note.  The  patient understood and accepted these risks and agreed to proceed.  We  did discuss left radial artery harvest, and the patient understood the  specific risks associated with that portion of the procedure.  He did  have a normal Allen test, and this was confirmed with pulse oximetry in  the preoperative holding area.   OPERATIVE NOTE:  [name] was brought to the preop holding area on  December 01, 2008.  There were lines placed by anesthesia for monitoring  arterial, central venous, and pulmonary arterial pressure.  Intravenous  antibiotics were administered.  The patient was taken to the operating  room, anesthetized, and intubated.  A Foley catheter was placed.  The  chest, abdomen, legs, and left arm were prepped and draped in the usual  fashion.  An incision was made over the volar aspect of the left wrist  overlying the radial pulse.  The fascia was incised exposing the radial  artery, a short segment was mobilized.  There was an excellent distal  pulse with proximal occlusion.  Incision then was extended to just below  the antecubital fossa, and the radial artery was harvested in standard  fashion using a harmonic scalpel.  Simultaneously, incision was made in  the medial aspect of the right leg at the level of the knee and the  greater saphenous vein was harvested endoscopically to the right groin.  The saphenous vein was of good quality; however, just above the knee,  the vein had bifurcated in both branches separately and were too small.  Additional vein was harvested from the right ankle in an open fashion.  This vein was satisfactory.  Simultaneously, a median sternotomy was  performed, and the left internal mammary artery was harvested using  standard technique.  Heparin 2000 units was administered during the  vessel harvest portion of procedure.  The remainder of the full heparin  dose was given prior to  opening the pericardium.   After the conduits had been harvested, the leg and arm incisions were  closed with a 3-0 Vicryl subcutaneous suture followed by a 3-0 Vicryl  subcuticular suture.  The left arm then was tucked to the patient's  side.  The pericardium was opened.  The ascending aorta was inspected.  There was no evidence of atherosclerotic disease.  The aorta was  cannulated via concentric 2-0 Ethibond pledgeted purse-string sutures.  A dual-stage venous cannula was placed via purse-string suture in right  atrial appendage.  Cardiopulmonary bypass was instituted, and the  patient was cooled to 32 degrees Celsius.  The coronary arteries were  inspected and anastomotic sites were chosen.  The conduits were  inspected and cut to length.  A foam pad was placed in the pericardium  to insulate the left phrenic nerve.  A temperature probe was placed in  myocardial septum, and a cardioplegic cannula was placed in the  ascending aorta.   The aorta was cross-clamped.  The left ventricle was emptied via the  aortic root vent.  Cardiac arrest then was achieved with combination of  cold antegrade blood cardioplegia and topical iced saline.  1.5 L of  cardioplegia was administered.  There was a rapid diastolic arrest and  myocardial septal cooling to 9 degrees Celsius.  The following distal  anastomoses were performed.   First, a reverse saphenous vein graft was placed sequentially to the  posterior descending and posterolateral branches of the right coronary.  There were multiple small branches in this distribution, and the 2  largest vessels were selected for grafting.  The posterior descending  accepted only a 1-mm probe distally, did accept a 1.5-mm probe  proximally.  A side-to-side anastomosis was performed to this vessel  with a running 7-0 Prolene suture.  All anastomoses were probed  proximally and distally at their completion to ensure patency.  The  distal end of the vein graft  then was cut to length and was anastomosed  end-to-side to the posterolateral which did accept a 1.5-mm probe.  Both  these anastomoses were done with running 7-0 Prolene sutures.  There was  good flow through the graft and good hemostasis with cardioplegia  administration.   Next, a reverse saphenous vein graft was placed end-to-side to the first  diagonal.  This also only accepted a 1-mm probe.  It was a fair quality  target.  The vein was anastomosed end-to-side with a running 7-0 Prolene  suture.   Additional cardioplegia was administered down the veins.  Next, the left  radial artery was anastomosed sequentially to obtuse marginals 2 and 3.  There were 4 obtuse marginal branches, 2 and 3 were the largest vessels  and the only  ones graftable.  OM 2 accepted only a 1-mm probe, OM 3  accepted a 1.5-mm probe.  A side-to-side anastomosis was performed to OM  2 and an end-to-side to OM 3, both with running 8-0 Prolene sutures.   Additional cardioplegia was administered down the veins as well as the  aortic root.  There was good backbleeding from the radial artery.   Next, the left internal mammary artery was brought through a window in  the pericardium.  It was anastomosed end-to-side to the distal LAD.  The  LAD accepted a 1.5-mm probe.  It was a good-quality vessel.  The mammary  was a good-quality 2-mm conduit.  It was anastomosed end-to-side with a  running 8-0 Prolene suture.  At the completion of the mammary to LAD  anastomosis, the bulldog clamps was briefly removed to inspect for  hemostasis.  Immediate rapid septal rewarming was noted.  The bulldog  clamp was replaced.  Additional cardioplegia was administered.   The vein grafts from radial artery were cut to length.  A cardioplegic  cannula was removed from the ascending aorta.  The saphenous vein to the  diagonal then was anastomosed to a 4.5-mm punch aortotomy with a running  6-0 Prolene suture.  An incision was made in  the hood of this vein graft  and the proximal anastomosis for the radial was placed to the vein.  This was done end-to-side with a running 7-0 Prolene suture.  Finally,  the proximal anastomosis for the graft to the right coronary branches  was performed to a 4.5-mm punch aortotomy.  The patient was placed in  Trendelenburg position and lidocaine was administered.  The bulldog  clamps were removed from the left mammary artery, and the aortic root  was de-aired, and the aortic cross-clamp was removed.  The total cross-  clamp time was 97 minutes.   The patient required a single defibrillation with 20 joules and remained  in sinus rhythm thereafter.  While the patient was being rewarmed, all  proximal and distal anastomoses were inspected for hemostasis.  Epicardial pacing wires were placed on the right ventricle and right  atrium, and the patient had rewarmed to a core temperature of 37 degrees  Celsius.  He was weaned from cardiopulmonary bypass on the first attempt  without difficulty.  The total bypass time was 131 minutes.  Initial  cardiac index greater than 2 L/m/m2.  The patient remained  hemodynamically stable throughout the post bypass period.   A test dose of protamine was administered and was well tolerated.  There  was a transient drop in blood pressure which responded to volume  administration and Neo-Synephrine.  The remainder of the protamine was  administered without incident.  The atrial and aortic cannulae were  removed.  Hemostasis was achieved.  The pericardium was reapproximated  with interrupted 3-0 silk sutures.  It came together easily without  tension.  A single mediastinal tube and left pleural tube were placed  through separate subcostal incisions, and the sternum was closed with a  combination of single and double heavy-gauge stainless steel wires.  Pectoralis fascia, subcutaneous tissue, and skin were closed in standard  fashion.  All sponge, needle, and  instrument counts were correct at the  end of the procedure.  There were no intraoperative complications.  The  patient was taken from the operating room to the surgical intensive care  unit in fair condition.      Salvatore Decent Dorris Fetch, M.D.  Electronically Signed  SCH/MEDQ  D:  12/01/2008  T:  12/02/2008  Job:  [id number]   cc:   Jonelle Sidle, MD  Verne Carrow, MD  Wende Crease, MD
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
## 2011-02-01 NOTE — Telephone Encounter (Signed)
Pt notified of results and verbalized understanding  

## 2011-03-15 ENCOUNTER — Encounter: Payer: Self-pay | Admitting: Cardiology

## 2011-04-03 ENCOUNTER — Other Ambulatory Visit: Payer: Self-pay | Admitting: *Deleted

## 2011-04-03 MED ORDER — METOPROLOL TARTRATE 50 MG PO TABS: 50.0000 mg | ORAL_TABLET | Freq: Two times a day (BID) | ORAL | Status: DC

## 2011-06-19 ENCOUNTER — Other Ambulatory Visit: Payer: Self-pay | Admitting: *Deleted

## 2011-06-19 MED ORDER — POTASSIUM CHLORIDE 10 MEQ PO TBCR: 10.0000 meq | EXTENDED_RELEASE_TABLET | Freq: Every day | ORAL | Status: DC

## 2011-06-19 MED ORDER — FUROSEMIDE 20 MG PO TABS: 20.0000 mg | ORAL_TABLET | Freq: Two times a day (BID) | ORAL | Status: DC

## 2011-11-06 ENCOUNTER — Telehealth: Payer: Self-pay | Admitting: *Deleted

## 2011-11-06 DIAGNOSIS — E782 Mixed hyperlipidemia: Secondary | ICD-10-CM

## 2011-11-06 MED ORDER — ROSUVASTATIN CALCIUM 5 MG PO TABS: 5.0000 mg | ORAL_TABLET | Freq: Every day | ORAL | Status: DC

## 2011-11-06 NOTE — Telephone Encounter (Signed)
Pt needs refill sent in for crestor 5mg  to [facility] in [place]

## 2011-11-10 ENCOUNTER — Telehealth: Payer: Self-pay | Admitting: *Deleted

## 2011-11-10 MED ORDER — METOPROLOL TARTRATE 50 MG PO TABS: 50.0000 mg | ORAL_TABLET | Freq: Two times a day (BID) | ORAL | Status: DC

## 2011-11-10 NOTE — Telephone Encounter (Signed)
Pt needs refill for metoprolol tart 50mg  1 po bid sent to [facility] in [place]

## 2011-12-08 ENCOUNTER — Other Ambulatory Visit: Payer: Self-pay | Admitting: *Deleted

## 2011-12-08 DIAGNOSIS — E782 Mixed hyperlipidemia: Secondary | ICD-10-CM

## 2011-12-08 MED ORDER — ROSUVASTATIN CALCIUM 5 MG PO TABS: 5.0000 mg | ORAL_TABLET | Freq: Every day | ORAL | Status: DC

## 2011-12-08 NOTE — Telephone Encounter (Signed)
Pt needs refill on crestor sent to [facility] in [place]

## 2012-01-08 ENCOUNTER — Telehealth: Payer: Self-pay | Admitting: *Deleted

## 2012-01-08 MED ORDER — METOPROLOL TARTRATE 50 MG PO TABS: 50.0000 mg | ORAL_TABLET | Freq: Two times a day (BID) | ORAL | Status: DC

## 2012-01-08 NOTE — Telephone Encounter (Signed)
Pt needs refills send to [facility] in [place] for lopressor 50mg  1 po bid

## 2012-01-22 ENCOUNTER — Telehealth: Payer: Self-pay | Admitting: *Deleted

## 2012-01-22 MED ORDER — METOPROLOL TARTRATE 50 MG PO TABS: 50.0000 mg | ORAL_TABLET | Freq: Two times a day (BID) | ORAL | Status: DC

## 2012-01-22 NOTE — Telephone Encounter (Signed)
Needs refills on his metoprolol 50mg  bid sent [place], [place]

## 2012-01-24 ENCOUNTER — Telehealth: Payer: Self-pay | Admitting: *Deleted

## 2012-01-24 NOTE — Telephone Encounter (Signed)
Pt needs refill on klor con sent to [facility] in [place]

## 2012-01-31 ENCOUNTER — Other Ambulatory Visit: Payer: Self-pay | Admitting: *Deleted

## 2012-01-31 MED ORDER — POTASSIUM CHLORIDE CRYS ER 10 MEQ PO TBCR: 10.0000 meq | EXTENDED_RELEASE_TABLET | Freq: Every day | ORAL | Status: DC

## 2012-03-08 ENCOUNTER — Other Ambulatory Visit: Payer: Self-pay | Admitting: *Deleted

## 2012-03-08 MED ORDER — POTASSIUM CHLORIDE CRYS ER 10 MEQ PO TBCR: 10.0000 meq | EXTENDED_RELEASE_TABLET | Freq: Every day | ORAL | Status: DC

## 2012-04-09 ENCOUNTER — Other Ambulatory Visit: Payer: Self-pay | Admitting: *Deleted

## 2012-06-27 ENCOUNTER — Telehealth: Payer: Self-pay | Admitting: Cardiology

## 2012-06-27 NOTE — Telephone Encounter (Signed)
Called patient and let him know that we could not refill medication Crestor because he has not been seen in our office in over a year. I told him that he could either make a appointment with our office or send the refill to his primary care doctor. I transferred his call to [facility] and let her schedule his appointment per patient request.

## 2012-07-11 ENCOUNTER — Encounter: Payer: Self-pay | Admitting: Cardiology

## 2012-07-11 ENCOUNTER — Ambulatory Visit (INDEPENDENT_AMBULATORY_CARE_PROVIDER_SITE_OTHER): Payer: BC Managed Care – PPO | Admitting: Cardiology

## 2012-07-11 VITALS — BP 150/92 | HR 87 | Ht 67.0 in | Wt 199.0 lb

## 2012-07-11 DIAGNOSIS — J984 Other disorders of lung: Secondary | ICD-10-CM

## 2012-07-11 DIAGNOSIS — R0989 Other specified symptoms and signs involving the circulatory and respiratory systems: Secondary | ICD-10-CM

## 2012-07-11 DIAGNOSIS — I10 Essential (primary) hypertension: Secondary | ICD-10-CM

## 2012-07-11 DIAGNOSIS — E782 Mixed hyperlipidemia: Secondary | ICD-10-CM

## 2012-07-11 DIAGNOSIS — I2510 Atherosclerotic heart disease of native coronary artery without angina pectoris: Secondary | ICD-10-CM

## 2012-07-11 MED ORDER — METOPROLOL TARTRATE 50 MG PO TABS: 50.0000 mg | ORAL_TABLET | Freq: Two times a day (BID) | ORAL | Status: DC

## 2012-07-11 MED ORDER — POTASSIUM CHLORIDE CRYS ER 10 MEQ PO TBCR: 10.0000 meq | EXTENDED_RELEASE_TABLET | Freq: Every day | ORAL | Status: DC

## 2012-07-11 MED ORDER — ROSUVASTATIN CALCIUM 5 MG PO TABS: 5.0000 mg | ORAL_TABLET | Freq: Every day | ORAL | Status: DC

## 2012-07-11 NOTE — Assessment & Plan Note (Signed)
Carotid Dopplers will be obtained. 

## 2012-07-11 NOTE — Assessment & Plan Note (Signed)
Blood pressure elevated today. He stated he was taking Lopressor only once a day to conserve pills. Recommended he followup with his primary care physician once back on his full regimen.

## 2012-07-11 NOTE — Assessment & Plan Note (Signed)
History of abnormal chest CT from January 2012 at [facility], right lower lobe nodule described. Patient indicates that he has had a "spot" on his lung for years. Not certain about the followup for this over time however. I have once again recommended that he discuss this further with his primary care physician and at least consider a followup chest CT to ensure stability. If not, he may need additional evaluation with PET imaging.
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
## 2012-07-11 NOTE — Patient Instructions (Addendum)
Your physician recommends that you schedule a follow-up appointment in: 1 year   ADDENDUM:  Pt needs to have CUS scheduled for Carotid Bruit

## 2012-07-11 NOTE — Assessment & Plan Note (Signed)
Continues on low-dose Crestor. Lipids have been followed in [place]. Goal LDL should be close to 70 if possible.

## 2012-07-11 NOTE — Assessment & Plan Note (Signed)
Symptomatically stable on medical therapy. ECG reviewed. Refills provided for Lopressor, potassium, and Crestor, otherwise continue current regimen. Annual followup arranged.

## 2012-07-16 ENCOUNTER — Other Ambulatory Visit: Payer: Self-pay | Admitting: Cardiology

## 2012-07-16 MED ORDER — FUROSEMIDE 20 MG PO TABS: 20.0000 mg | ORAL_TABLET | Freq: Two times a day (BID) | ORAL | Status: DC

## 2012-07-18 ENCOUNTER — Other Ambulatory Visit (HOSPITAL_COMMUNITY): Payer: BC Managed Care – PPO

## 2012-08-05 ENCOUNTER — Ambulatory Visit (HOSPITAL_COMMUNITY)
Admission: RE | Admit: 2012-08-05 | Discharge: 2012-08-05 | Disposition: A | Discharge status: Home / Self Care | Payer: BC Managed Care – PPO | Source: Ambulatory Visit | Attending: Cardiology | Admitting: Cardiology

## 2012-08-05 DIAGNOSIS — I6529 Occlusion and stenosis of unspecified carotid artery: Secondary | ICD-10-CM | POA: Insufficient documentation

## 2012-08-05 DIAGNOSIS — E782 Mixed hyperlipidemia: Secondary | ICD-10-CM

## 2012-08-05 DIAGNOSIS — E785 Hyperlipidemia, unspecified: Secondary | ICD-10-CM | POA: Insufficient documentation

## 2013-03-07 ENCOUNTER — Other Ambulatory Visit: Payer: Self-pay | Admitting: Cardiology

## 2013-07-10 ENCOUNTER — Other Ambulatory Visit: Payer: Self-pay | Admitting: Cardiology

## 2013-07-29 ENCOUNTER — Other Ambulatory Visit: Payer: Self-pay | Admitting: Cardiology

## 2013-08-11 ENCOUNTER — Other Ambulatory Visit: Payer: Self-pay | Admitting: Cardiology

## 2013-09-30 ENCOUNTER — Other Ambulatory Visit: Payer: Self-pay | Admitting: Cardiology

## 2013-12-08 ENCOUNTER — Other Ambulatory Visit: Payer: Self-pay | Admitting: Cardiology

## 2014-01-10 ENCOUNTER — Other Ambulatory Visit: Payer: Self-pay | Admitting: Cardiology

## 2014-01-22 ENCOUNTER — Encounter (HOSPITAL_COMMUNITY): Payer: Self-pay | Admitting: Emergency Medicine

## 2014-01-22 ENCOUNTER — Inpatient Hospital Stay (HOSPITAL_COMMUNITY)
Admission: EM | Admit: 2014-01-22 | Discharge: 2014-02-16 | DRG: 405 | Disposition: E | Payer: BC Managed Care – PPO | Attending: Pulmonary Disease | Admitting: Pulmonary Disease

## 2014-01-22 ENCOUNTER — Encounter (HOSPITAL_COMMUNITY): Admission: EM | Disposition: E | Payer: Self-pay | Source: Home / Self Care | Attending: Pulmonary Disease

## 2014-01-22 DIAGNOSIS — K219 Gastro-esophageal reflux disease without esophagitis: Secondary | ICD-10-CM | POA: Diagnosis present

## 2014-01-22 DIAGNOSIS — R4020 Unspecified coma: Secondary | ICD-10-CM

## 2014-01-22 DIAGNOSIS — E7220 Disorder of urea cycle metabolism, unspecified: Secondary | ICD-10-CM | POA: Diagnosis present

## 2014-01-22 DIAGNOSIS — E782 Mixed hyperlipidemia: Secondary | ICD-10-CM | POA: Diagnosis present

## 2014-01-22 DIAGNOSIS — Z66 Do not resuscitate: Secondary | ICD-10-CM

## 2014-01-22 DIAGNOSIS — K766 Portal hypertension: Secondary | ICD-10-CM

## 2014-01-22 DIAGNOSIS — D62 Acute posthemorrhagic anemia: Secondary | ICD-10-CM | POA: Diagnosis present

## 2014-01-22 DIAGNOSIS — Z515 Encounter for palliative care: Secondary | ICD-10-CM

## 2014-01-22 DIAGNOSIS — K5900 Constipation, unspecified: Secondary | ICD-10-CM | POA: Diagnosis not present

## 2014-01-22 DIAGNOSIS — N179 Acute kidney failure, unspecified: Secondary | ICD-10-CM

## 2014-01-22 DIAGNOSIS — R578 Other shock: Secondary | ICD-10-CM | POA: Diagnosis present

## 2014-01-22 DIAGNOSIS — K222 Esophageal obstruction: Secondary | ICD-10-CM | POA: Insufficient documentation

## 2014-01-22 DIAGNOSIS — I2510 Atherosclerotic heart disease of native coronary artery without angina pectoris: Secondary | ICD-10-CM | POA: Diagnosis present

## 2014-01-22 DIAGNOSIS — K7682 Hepatic encephalopathy: Secondary | ICD-10-CM | POA: Diagnosis present

## 2014-01-22 DIAGNOSIS — K921 Melena: Secondary | ICD-10-CM | POA: Diagnosis present

## 2014-01-22 DIAGNOSIS — K7030 Alcoholic cirrhosis of liver without ascites: Secondary | ICD-10-CM

## 2014-01-22 DIAGNOSIS — I8511 Secondary esophageal varices with bleeding: Secondary | ICD-10-CM

## 2014-01-22 DIAGNOSIS — E870 Hyperosmolality and hypernatremia: Secondary | ICD-10-CM | POA: Diagnosis not present

## 2014-01-22 DIAGNOSIS — K7290 Hepatic failure, unspecified without coma: Secondary | ICD-10-CM | POA: Diagnosis present

## 2014-01-22 DIAGNOSIS — J984 Other disorders of lung: Secondary | ICD-10-CM | POA: Diagnosis present

## 2014-01-22 DIAGNOSIS — Z87891 Personal history of nicotine dependence: Secondary | ICD-10-CM

## 2014-01-22 DIAGNOSIS — I469 Cardiac arrest, cause unspecified: Secondary | ICD-10-CM | POA: Diagnosis not present

## 2014-01-22 DIAGNOSIS — D689 Coagulation defect, unspecified: Secondary | ICD-10-CM | POA: Diagnosis not present

## 2014-01-22 DIAGNOSIS — K639 Disease of intestine, unspecified: Secondary | ICD-10-CM | POA: Diagnosis present

## 2014-01-22 DIAGNOSIS — I959 Hypotension, unspecified: Secondary | ICD-10-CM | POA: Diagnosis not present

## 2014-01-22 DIAGNOSIS — R6520 Severe sepsis without septic shock: Secondary | ICD-10-CM

## 2014-01-22 DIAGNOSIS — E872 Acidosis, unspecified: Secondary | ICD-10-CM

## 2014-01-22 DIAGNOSIS — I8500 Esophageal varices without bleeding: Secondary | ICD-10-CM | POA: Diagnosis present

## 2014-01-22 DIAGNOSIS — I10 Essential (primary) hypertension: Secondary | ICD-10-CM | POA: Diagnosis present

## 2014-01-22 DIAGNOSIS — R17 Unspecified jaundice: Secondary | ICD-10-CM | POA: Diagnosis present

## 2014-01-22 DIAGNOSIS — J690 Pneumonitis due to inhalation of food and vomit: Secondary | ICD-10-CM

## 2014-01-22 DIAGNOSIS — K319 Disease of stomach and duodenum, unspecified: Secondary | ICD-10-CM

## 2014-01-22 DIAGNOSIS — C9110 Chronic lymphocytic leukemia of B-cell type not having achieved remission: Secondary | ICD-10-CM | POA: Diagnosis present

## 2014-01-22 DIAGNOSIS — K922 Gastrointestinal hemorrhage, unspecified: Secondary | ICD-10-CM | POA: Diagnosis present

## 2014-01-22 DIAGNOSIS — E876 Hypokalemia: Secondary | ICD-10-CM | POA: Diagnosis not present

## 2014-01-22 DIAGNOSIS — Z951 Presence of aortocoronary bypass graft: Secondary | ICD-10-CM

## 2014-01-22 DIAGNOSIS — R739 Hyperglycemia, unspecified: Secondary | ICD-10-CM | POA: Diagnosis present

## 2014-01-22 DIAGNOSIS — Z8249 Family history of ischemic heart disease and other diseases of the circulatory system: Secondary | ICD-10-CM

## 2014-01-22 DIAGNOSIS — R6521 Severe sepsis with septic shock: Secondary | ICD-10-CM

## 2014-01-22 DIAGNOSIS — R571 Hypovolemic shock: Secondary | ICD-10-CM | POA: Diagnosis present

## 2014-01-22 DIAGNOSIS — R000 Tachycardia, unspecified: Secondary | ICD-10-CM | POA: Diagnosis present

## 2014-01-22 DIAGNOSIS — D696 Thrombocytopenia, unspecified: Secondary | ICD-10-CM | POA: Diagnosis present

## 2014-01-22 DIAGNOSIS — K920 Hematemesis: Secondary | ICD-10-CM | POA: Diagnosis present

## 2014-01-22 DIAGNOSIS — D649 Anemia, unspecified: Secondary | ICD-10-CM | POA: Diagnosis present

## 2014-01-22 DIAGNOSIS — A419 Sepsis, unspecified organism: Secondary | ICD-10-CM

## 2014-01-22 DIAGNOSIS — R579 Shock, unspecified: Secondary | ICD-10-CM

## 2014-01-22 DIAGNOSIS — R188 Other ascites: Secondary | ICD-10-CM | POA: Diagnosis present

## 2014-01-22 DIAGNOSIS — J9600 Acute respiratory failure, unspecified whether with hypoxia or hypercapnia: Secondary | ICD-10-CM

## 2014-01-22 DIAGNOSIS — R911 Solitary pulmonary nodule: Secondary | ICD-10-CM | POA: Diagnosis present

## 2014-01-22 DIAGNOSIS — R7309 Other abnormal glucose: Secondary | ICD-10-CM | POA: Diagnosis not present

## 2014-01-22 DIAGNOSIS — Z7982 Long term (current) use of aspirin: Secondary | ICD-10-CM

## 2014-01-22 HISTORY — DX: Alcohol abuse, uncomplicated: F10.10

## 2014-01-22 HISTORY — PX: ESOPHAGOGASTRODUODENOSCOPY: SHX5428

## 2014-01-22 LAB — CBC
HCT: 24.3 % — ABNORMAL LOW (ref 39.0–52.0)
HEMATOCRIT: 25.1 % — AB (ref 39.0–52.0)
HEMOGLOBIN: 8.5 g/dL — AB (ref 13.0–17.0)
HEMOGLOBIN: 8.1 g/dL — AB (ref 13.0–17.0)
MCH: 29.8 pg (ref 26.0–34.0)
MCH: 28.9 pg (ref 26.0–34.0)
MCHC: 33.9 g/dL (ref 30.0–36.0)
MCHC: 33.3 g/dL (ref 30.0–36.0)
MCV: 88.1 fL (ref 78.0–100.0)
MCV: 86.8 fL (ref 78.0–100.0)
Platelets: 147 10*3/uL — ABNORMAL LOW (ref 150–400)
Platelets: 125 10*3/uL — ABNORMAL LOW (ref 150–400)
RBC: 2.85 MIL/uL — ABNORMAL LOW (ref 4.22–5.81)
RBC: 2.8 MIL/uL — ABNORMAL LOW (ref 4.22–5.81)
RDW: 17.2 % — ABNORMAL HIGH (ref 11.5–15.5)
RDW: 16.7 % — AB (ref 11.5–15.5)
WBC: 13.1 10*3/uL — ABNORMAL HIGH (ref 4.0–10.5)
WBC: 13.2 10*3/uL — ABNORMAL HIGH (ref 4.0–10.5)

## 2014-01-22 LAB — PROTIME-INR
INR: 1.42 (ref 0.00–1.49)
PROTHROMBIN TIME: 17 s — AB (ref 11.6–15.2)

## 2014-01-22 LAB — HEPATIC FUNCTION PANEL
ALT: 16 U/L (ref 0–53)
AST: 36 U/L (ref 0–37)
Albumin: 2.6 g/dL — ABNORMAL LOW (ref 3.5–5.2)
Alkaline Phosphatase: 113 U/L (ref 39–117)
BILIRUBIN TOTAL: 1.3 mg/dL — AB (ref 0.3–1.2)
Bilirubin, Direct: 0.3 mg/dL (ref 0.0–0.3)
Indirect Bilirubin: 1 mg/dL — ABNORMAL HIGH (ref 0.3–0.9)
Total Protein: 7 g/dL (ref 6.0–8.3)

## 2014-01-22 LAB — CBC WITH DIFFERENTIAL/PLATELET
BASOS PCT: 1 % (ref 0–1)
Basophils Absolute: 0.1 10*3/uL (ref 0.0–0.1)
EOS ABS: 0 10*3/uL (ref 0.0–0.7)
Eosinophils Relative: 0 % (ref 0–5)
HCT: 31.3 % — ABNORMAL LOW (ref 39.0–52.0)
Hemoglobin: 10.5 g/dL — ABNORMAL LOW (ref 13.0–17.0)
Lymphocytes Relative: 20 % (ref 12–46)
Lymphs Abs: 2.3 10*3/uL (ref 0.7–4.0)
MCH: 29.6 pg (ref 26.0–34.0)
MCHC: 33.5 g/dL (ref 30.0–36.0)
MCV: 88.2 fL (ref 78.0–100.0)
Monocytes Absolute: 1.3 10*3/uL — ABNORMAL HIGH (ref 0.1–1.0)
Monocytes Relative: 11 % (ref 3–12)
NEUTROS PCT: 68 % (ref 43–77)
Neutro Abs: 7.7 10*3/uL (ref 1.7–7.7)
PLATELETS: 184 10*3/uL (ref 150–400)
RBC: 3.55 MIL/uL — ABNORMAL LOW (ref 4.22–5.81)
RDW: 17.1 % — AB (ref 11.5–15.5)
WBC: 11.4 10*3/uL — ABNORMAL HIGH (ref 4.0–10.5)

## 2014-01-22 LAB — BASIC METABOLIC PANEL
BUN: 22 mg/dL (ref 6–23)
CALCIUM: 8.9 mg/dL (ref 8.4–10.5)
CO2: 23 mEq/L (ref 19–32)
Chloride: 100 mEq/L (ref 96–112)
Creatinine, Ser: 0.69 mg/dL (ref 0.50–1.35)
GFR calc Af Amer: >90 mL/min (ref >90)
Glucose, Bld: 148 mg/dL — ABNORMAL HIGH (ref 70–99)
POTASSIUM: 4.3 meq/L (ref 3.7–5.3)
SODIUM: 137 meq/L (ref 137–147)

## 2014-01-22 LAB — ABO/RH: ABO/RH(D): O POS

## 2014-01-22 LAB — PREPARE RBC (CROSSMATCH)

## 2014-01-22 LAB — AMMONIA: Ammonia: 154 umol/L — ABNORMAL HIGH (ref 11–60)

## 2014-01-22 LAB — POC OCCULT BLOOD, ED: Fecal Occult Bld: POSITIVE — AB

## 2014-01-22 SURGERY — EGD (ESOPHAGOGASTRODUODENOSCOPY)
Anesthesia: Moderate Sedation

## 2014-01-22 MED ORDER — BUTAMBEN-TETRACAINE-BENZOCAINE 2-2-14 % EX AERO
INHALATION_SPRAY | CUTANEOUS | Status: DC | PRN
Start: 2014-01-22 — End: 2014-01-22
  Administered 2014-01-22: 2 via TOPICAL
  Administered 2014-01-22: 1 via TOPICAL

## 2014-01-22 MED ORDER — ONDANSETRON HCL 4 MG PO TABS: 4.0000 mg | ORAL_TABLET | Freq: Four times a day (QID) | ORAL | Status: DC | PRN

## 2014-01-22 MED ORDER — METOCLOPRAMIDE HCL 5 MG/ML IJ SOLN
10.0000 mg | Freq: Four times a day (QID) | INTRAMUSCULAR | Status: AC
  Administered 2014-01-23: 10 mg via INTRAVENOUS
  Filled 2014-01-22 (×2): qty 2

## 2014-01-22 MED ORDER — ONDANSETRON HCL 4 MG/2ML IJ SOLN
INTRAMUSCULAR | Status: AC
  Administered 2014-01-22: 4 mg
  Filled 2014-01-22: qty 2

## 2014-01-22 MED ORDER — LORAZEPAM 2 MG/ML IJ SOLN: 1.0000 mg | Freq: Four times a day (QID) | INTRAMUSCULAR | Status: DC | PRN

## 2014-01-22 MED ORDER — SODIUM CHLORIDE 0.9 % IV SOLN
INTRAVENOUS | Status: DC | PRN
Start: 2014-01-22 — End: 2014-01-22
  Administered 2014-01-22: 1000 mL via INTRAMUSCULAR

## 2014-01-22 MED ORDER — MEPERIDINE HCL 50 MG/ML IJ SOLN
INTRAMUSCULAR | Status: AC
  Filled 2014-01-22: qty 1

## 2014-01-22 MED ORDER — SODIUM CHLORIDE 0.9 % IV SOLN: INTRAVENOUS | Status: DC

## 2014-01-22 MED ORDER — LORAZEPAM 1 MG PO TABS: 1.0000 mg | ORAL_TABLET | Freq: Four times a day (QID) | ORAL | Status: DC | PRN

## 2014-01-22 MED ORDER — SODIUM CHLORIDE 0.9 % IV BOLUS (SEPSIS)
1000.0000 mL | Freq: Once | INTRAVENOUS | Status: AC
  Administered 2014-01-22: 1000 mL via INTRAVENOUS

## 2014-01-22 MED ORDER — SODIUM CHLORIDE 0.9 % IV BOLUS (SEPSIS): 500.0000 mL | Freq: Once | INTRAVENOUS | Status: DC

## 2014-01-22 MED ORDER — MORPHINE SULFATE 2 MG/ML IJ SOLN: 1.0000 mg | INTRAMUSCULAR | Status: DC | PRN

## 2014-01-22 MED ORDER — STERILE WATER FOR IRRIGATION IR SOLN
Status: DC | PRN
  Administered 2014-01-22: 17:00:00

## 2014-01-22 MED ORDER — METOCLOPRAMIDE HCL 5 MG/ML IJ SOLN
10.0000 mg | INTRAMUSCULAR | Status: AC
  Administered 2014-01-22: 10 mg via INTRAVENOUS
  Filled 2014-01-22: qty 2

## 2014-01-22 MED ORDER — DEXTROSE 5 % IV SOLN
1.0000 g | INTRAVENOUS | Status: DC
  Administered 2014-01-23: 1 g via INTRAVENOUS
  Filled 2014-01-22 (×5): qty 10

## 2014-01-22 MED ORDER — MIDAZOLAM HCL 5 MG/5ML IJ SOLN
INTRAMUSCULAR | Status: DC | PRN
  Administered 2014-01-22 (×2): 2 mg via INTRAVENOUS

## 2014-01-22 MED ORDER — ACETAMINOPHEN 325 MG PO TABS: 650.0000 mg | ORAL_TABLET | Freq: Four times a day (QID) | ORAL | Status: DC | PRN

## 2014-01-22 MED ORDER — ACETAMINOPHEN 650 MG RE SUPP: 650.0000 mg | Freq: Four times a day (QID) | RECTAL | Status: DC | PRN

## 2014-01-22 MED ORDER — SODIUM CHLORIDE 0.9 % IV SOLN
80.0000 mg | Freq: Once | INTRAVENOUS | Status: AC
  Administered 2014-01-22: 80 mg via INTRAVENOUS
  Filled 2014-01-22: qty 80

## 2014-01-22 MED ORDER — ONDANSETRON HCL 4 MG/2ML IJ SOLN: 4.0000 mg | Freq: Four times a day (QID) | INTRAMUSCULAR | Status: DC | PRN

## 2014-01-22 MED ORDER — DIPHENHYDRAMINE HCL 50 MG/ML IJ SOLN: 25.0000 mg | Freq: Once | INTRAMUSCULAR | Status: DC

## 2014-01-22 MED ORDER — SODIUM CHLORIDE 0.9 % IV SOLN
25.0000 ug/h | INTRAVENOUS | Status: DC
  Administered 2014-01-22 – 2014-01-24 (×3): 25 ug/h via INTRAVENOUS
  Filled 2014-01-22 (×10): qty 1

## 2014-01-22 MED ORDER — ONDANSETRON HCL 4 MG/2ML IJ SOLN
4.0000 mg | Freq: Once | INTRAMUSCULAR | Status: AC
  Administered 2014-01-22: 4 mg via INTRAVENOUS
  Filled 2014-01-22: qty 2

## 2014-01-22 MED ORDER — MIDAZOLAM HCL 5 MG/5ML IJ SOLN
INTRAMUSCULAR | Status: AC
  Filled 2014-01-22: qty 10

## 2014-01-22 MED ORDER — SODIUM CHLORIDE 0.9 % IJ SOLN
INTRAMUSCULAR | Status: AC
  Filled 2014-01-22: qty 10

## 2014-01-22 MED ORDER — FAMOTIDINE IN NACL 20-0.9 MG/50ML-% IV SOLN: 20.0000 mg | Freq: Once | INTRAVENOUS | Status: DC

## 2014-01-22 MED ORDER — PROMETHAZINE HCL 25 MG/ML IJ SOLN
INTRAMUSCULAR | Status: AC
  Filled 2014-01-22: qty 1

## 2014-01-22 MED ORDER — SODIUM CHLORIDE 0.9 % IV SOLN
INTRAVENOUS | Status: DC
  Administered 2014-01-22 – 2014-01-26 (×7): via INTRAVENOUS
  Administered 2014-01-26: 1000 mL via INTRAVENOUS
  Administered 2014-01-27: 500 mL via INTRAVENOUS

## 2014-01-22 MED ORDER — METOCLOPRAMIDE HCL 5 MG/ML IJ SOLN: 10.0000 mg | Freq: Once | INTRAMUSCULAR | Status: DC

## 2014-01-22 MED ORDER — SODIUM CHLORIDE 0.9 % IV SOLN
8.0000 mg/h | INTRAVENOUS | Status: DC
  Administered 2014-01-22 (×2): 8 mg/h via INTRAVENOUS
  Filled 2014-01-22 (×14): qty 80

## 2014-01-22 MED ORDER — OCTREOTIDE LOAD VIA INFUSION
25.0000 ug | Freq: Once | INTRAVENOUS | Status: AC
  Filled 2014-01-22: qty 13

## 2014-01-22 NOTE — Op Note (Addendum)
EGD PROCEDURE REPORT  PATIENT:  [name]  MR#:  [id number] Birthdate:  03-06-1951, 63 y.o., male Endoscopist:  Dr. Rogene Houston, MD Referred By:  Dr. Kathie Dike, MD Procedure Date: 01/21/2014  Procedure:   EGD  Indications:  Patient is 63 year old Caucasian male who presents with upper GI bleed and anemia. Patient uses BC powder on regular basis. He also drinks 2-3 cans of beer daily. There is no history of optic ulcer disease or cirrhosis. He has chronic GERD complicated by esophageal stricture and has undergone multiple dilations in the past.            Informed Consent:  The risks, benefits, alternatives & imponderables which include, but are not limited to, bleeding, infection, perforation, drug reaction and potential missed lesion have been reviewed.  The potential for biopsy, lesion removal, esophageal dilation, etc. have also been discussed.  Questions have been answered.  All parties agreeable.  Please see history & physical in medical record for more information.  Medications:  Versed 4 mg IV Cetacaine spray topically for oropharyngeal anesthesia  Description of procedure:  The endoscope was introduced through the mouth and advanced to the second portion of the duodenum without difficulty or limitations. The mucosal surfaces were surveyed very carefully during advancement of the scope and upon withdrawal.  Findings:  Esophagus:  Mucosa of  proximal segment was normal. 2 columns of grade 2 esophageal varices noted involving distal 8 cm of the esophagus. In the distal 2-3 cm mucosa was salmon-colored with geographic pattern. No bleeding lesion identified Small sliding hiatal hernia noted. Stomach:  Multiple clots noted at proximal stomach along with [place] is some fresh blood. No bleeding lesions noted at antrum and pyloric channel as well as angularis. No bleeding lesion noted at cardia. Proximal stomach not be examined. Duodenum: There was some blood coating bulbar  mucosa but no lesion noted. Post bulbar mucosa was normal. Therapeutic/Diagnostic Maneuvers Performed:  None  Complications:  None  Impression: Two columns of grade 2 esophageal varices without stigmata of bleed. Salmon colored mucosa at distal esophagus with geographic pattern suggestive of Barrett's esophagus. Small sliding hiatal hernia. Stomach full of clots and some blood. Examination incomplete but no bleeding lesion identified. No evidence of duodenal ulcer.  Recommendations:  Metoclopramide 10 mg IV every 6 hours or 3 doses. Begin octreotide infusion at a rate of 25 mcg per hour after bolus of 25 mcg. Transfuse 1 unit of PRBCs. Posttransfusion H&H. Ceftriaxone 1 g IV every 24 hours(cirrhotic with GIB). Repeat EGD in am.  Mechele Dawley Rehman  01/29/2014  5:08 PM  CC: Dr. Earney Mallet, MD & Dr. Rayne Du ref. provider found
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
## 2014-01-22 NOTE — ED Notes (Signed)
Pt vomited again. Moderate amount of bright blood. Dr Laural Golden here to eval

## 2014-01-22 NOTE — ED Notes (Signed)
Pt vomited moderate amount of bright red blood. States this makes the fourth time he vomited bright blood since last night

## 2014-01-22 NOTE — H&P (Signed)
Patient seen and examined.  Above note reviewed.  He was seen in ICU after undergoing endoscopy for upper GI bleeding.  He is drowsy from medications at this time and cannot participate in history.  Family at bedside.  Blood pressure remains lower in 78'H systolic, but heart rate has improved and is currently down into the 80s.  Per HPI, he was taking frequent BC powders and also drinks regularly.  EGD showed esophageal varices without stigmata of recent bleed.  Blood and blood clots were noted to be in the stomach.  No other source of active bleeding was found. Discussed with Dr. Laural Golden, and recommendations included to continue on protonix infusion and add octreotide infusion. Hemoglobin has dropped from 10.5 to 8.5.  He will be transfused 1 unit of prbc at this time.  Will continue to monitor H/H every 6 hours and transfuse for hemoglobin less than 8.  Continue to monitor in step down unit.  Critical care: 35 minutes.  [employer]

## 2014-01-22 NOTE — ED Notes (Signed)
Pt reports vomiting blood x 3 since 3am.  Pt reports has noticed black stools x 3 days.   Denies abd pain.

## 2014-01-23 ENCOUNTER — Encounter (HOSPITAL_COMMUNITY): Admission: EM | Disposition: E | Payer: Self-pay | Source: Home / Self Care | Attending: Pulmonary Disease

## 2014-01-23 ENCOUNTER — Encounter (HOSPITAL_COMMUNITY): Payer: Self-pay | Admitting: Anesthesiology

## 2014-01-23 ENCOUNTER — Inpatient Hospital Stay (HOSPITAL_COMMUNITY): Payer: BC Managed Care – PPO

## 2014-01-23 ENCOUNTER — Encounter (HOSPITAL_COMMUNITY): Payer: BC Managed Care – PPO

## 2014-01-23 ENCOUNTER — Encounter (HOSPITAL_COMMUNITY): Payer: Self-pay | Admitting: *Deleted

## 2014-01-23 DIAGNOSIS — I959 Hypotension, unspecified: Secondary | ICD-10-CM | POA: Diagnosis not present

## 2014-01-23 DIAGNOSIS — K922 Gastrointestinal hemorrhage, unspecified: Secondary | ICD-10-CM

## 2014-01-23 DIAGNOSIS — R578 Other shock: Secondary | ICD-10-CM

## 2014-01-23 DIAGNOSIS — E7220 Disorder of urea cycle metabolism, unspecified: Secondary | ICD-10-CM | POA: Diagnosis present

## 2014-01-23 DIAGNOSIS — I8500 Esophageal varices without bleeding: Secondary | ICD-10-CM

## 2014-01-23 DIAGNOSIS — D649 Anemia, unspecified: Secondary | ICD-10-CM

## 2014-01-23 DIAGNOSIS — R571 Hypovolemic shock: Secondary | ICD-10-CM | POA: Diagnosis present

## 2014-01-23 DIAGNOSIS — R17 Unspecified jaundice: Secondary | ICD-10-CM | POA: Diagnosis present

## 2014-01-23 HISTORY — PX: ESOPHAGOGASTRODUODENOSCOPY: SHX5428

## 2014-01-23 LAB — HEPATIC FUNCTION PANEL
ALT: 19 U/L (ref 0–53)
AST: 51 U/L — ABNORMAL HIGH (ref 0–37)
Albumin: 2.1 g/dL — ABNORMAL LOW (ref 3.5–5.2)
Alkaline Phosphatase: 81 U/L (ref 39–117)
BILIRUBIN DIRECT: 1.1 mg/dL — AB (ref 0.0–0.3)
BILIRUBIN INDIRECT: 2.4 mg/dL — AB (ref 0.3–0.9)
TOTAL PROTEIN: 5.2 g/dL — AB (ref 6.0–8.3)
Total Bilirubin: 3.5 mg/dL — ABNORMAL HIGH (ref 0.3–1.2)

## 2014-01-23 LAB — COMPREHENSIVE METABOLIC PANEL
ALBUMIN: 2 g/dL — AB (ref 3.5–5.2)
ALT: 11 U/L (ref 0–53)
AST: 25 U/L (ref 0–37)
Alkaline Phosphatase: 72 U/L (ref 39–117)
BILIRUBIN TOTAL: 1.8 mg/dL — AB (ref 0.3–1.2)
BUN: 23 mg/dL (ref 6–23)
CHLORIDE: 113 meq/L — AB (ref 96–112)
CO2: 20 mEq/L (ref 19–32)
Calcium: 7.8 mg/dL — ABNORMAL LOW (ref 8.4–10.5)
Creatinine, Ser: 0.73 mg/dL (ref 0.50–1.35)
GFR calc Af Amer: >90 mL/min (ref >90)
GFR calc non Af Amer: >90 mL/min (ref >90)
Glucose, Bld: 147 mg/dL — ABNORMAL HIGH (ref 70–99)
POTASSIUM: 4.1 meq/L (ref 3.7–5.3)
SODIUM: 145 meq/L (ref 137–147)
TOTAL PROTEIN: 4.9 g/dL — AB (ref 6.0–8.3)

## 2014-01-23 LAB — CBC
HCT: 22.5 % — ABNORMAL LOW (ref 39.0–52.0)
HCT: 34 % — ABNORMAL LOW (ref 39.0–52.0)
Hemoglobin: 7.8 g/dL — ABNORMAL LOW (ref 13.0–17.0)
Hemoglobin: 11.5 g/dL — ABNORMAL LOW (ref 13.0–17.0)
MCH: 30.2 pg (ref 26.0–34.0)
MCH: 29.1 pg (ref 26.0–34.0)
MCHC: 34.7 g/dL (ref 30.0–36.0)
MCHC: 33.8 g/dL (ref 30.0–36.0)
MCV: 87.2 fL (ref 78.0–100.0)
MCV: 86.1 fL (ref 78.0–100.0)
PLATELETS: 130 10*3/uL — AB (ref 150–400)
PLATELETS: 89 10*3/uL — AB (ref 150–400)
RBC: 2.58 MIL/uL — ABNORMAL LOW (ref 4.22–5.81)
RBC: 3.95 MIL/uL — ABNORMAL LOW (ref 4.22–5.81)
RDW: 17.1 % — ABNORMAL HIGH (ref 11.5–15.5)
RDW: 15.5 % (ref 11.5–15.5)
WBC: 15.1 10*3/uL — ABNORMAL HIGH (ref 4.0–10.5)
WBC: 22.4 10*3/uL — ABNORMAL HIGH (ref 4.0–10.5)

## 2014-01-23 LAB — URINALYSIS, ROUTINE W REFLEX MICROSCOPIC
Bilirubin Urine: NEGATIVE
GLUCOSE, UA: NEGATIVE mg/dL
HGB URINE DIPSTICK: NEGATIVE
Ketones, ur: NEGATIVE mg/dL
Nitrite: NEGATIVE
PROTEIN: NEGATIVE mg/dL
Specific Gravity, Urine: 1.023 (ref 1.005–1.030)
Urobilinogen, UA: 0.2 mg/dL (ref 0.0–1.0)
pH: 5.5 (ref 5.0–8.0)

## 2014-01-23 LAB — POCT I-STAT 3, ART BLOOD GAS (G3+)
ACID-BASE DEFICIT: 6 mmol/L — AB (ref 0.0–2.0)
Bicarbonate: 18.5 mEq/L — ABNORMAL LOW (ref 20.0–24.0)
O2 SAT: 96 %
Patient temperature: 98.2
TCO2: 20 mmol/L (ref 0–100)
pCO2 arterial: 33.8 mmHg — ABNORMAL LOW (ref 35.0–45.0)
pH, Arterial: 7.346 — ABNORMAL LOW (ref 7.350–7.450)
pO2, Arterial: 82 mmHg (ref 80.0–100.0)

## 2014-01-23 LAB — BLOOD GAS, ARTERIAL
Acid-Base Excess: 11.8 mmol/L — ABNORMAL HIGH (ref 0.0–2.0)
BICARBONATE: 15.6 meq/L — AB (ref 20.0–24.0)
FIO2: 100 %
MECHVT: 600 mL
O2 Saturation: 97.8 %
PATIENT TEMPERATURE: 37
PEEP/CPAP: 5 cmH2O
PH ART: 7.138 — AB (ref 7.350–7.450)
RATE: 15 resp/min
TCO2: 15.5 mmol/L (ref 0–100)
pCO2 arterial: 48.1 mmHg — ABNORMAL HIGH (ref 35.0–45.0)
pO2, Arterial: 145 mmHg — ABNORMAL HIGH (ref 80.0–100.0)

## 2014-01-23 LAB — BASIC METABOLIC PANEL
BUN: 22 mg/dL (ref 6–23)
CO2: 20 mEq/L (ref 19–32)
CREATININE: 0.82 mg/dL (ref 0.50–1.35)
Calcium: 7.3 mg/dL — ABNORMAL LOW (ref 8.4–10.5)
Chloride: 113 mEq/L — ABNORMAL HIGH (ref 96–112)
Glucose, Bld: 209 mg/dL — ABNORMAL HIGH (ref 70–99)
Potassium: 4 mEq/L (ref 3.7–5.3)
Sodium: 146 mEq/L (ref 137–147)

## 2014-01-23 LAB — GLUCOSE, CAPILLARY
Glucose-Capillary: 206 mg/dL — ABNORMAL HIGH (ref 70–99)
Glucose-Capillary: 196 mg/dL — ABNORMAL HIGH (ref 70–99)

## 2014-01-23 LAB — URINE MICROSCOPIC-ADD ON

## 2014-01-23 LAB — MRSA PCR SCREENING: MRSA BY PCR: NEGATIVE

## 2014-01-23 LAB — PROTIME-INR
INR: 1.6 — AB (ref 0.00–1.49)
PROTHROMBIN TIME: 18.6 s — AB (ref 11.6–15.2)

## 2014-01-23 SURGERY — CANCELLED PROCEDURE

## 2014-01-23 SURGERY — EGD (ESOPHAGOGASTRODUODENOSCOPY)
Anesthesia: Moderate Sedation

## 2014-01-23 MED ORDER — BIOTENE DRY MOUTH MT LIQD
15.0000 mL | Freq: Four times a day (QID) | OROMUCOSAL | Status: DC
  Administered 2014-01-24 – 2014-01-30 (×26): 15 mL via OROMUCOSAL

## 2014-01-23 MED ORDER — FOLIC ACID 5 MG/ML IJ SOLN
1.0000 mg | Freq: Every day | INTRAMUSCULAR | Status: DC
  Administered 2014-01-23 – 2014-01-28 (×6): 1 mg via INTRAVENOUS
  Filled 2014-01-23 (×6): qty 0.2

## 2014-01-23 MED ORDER — SODIUM BICARBONATE 8.4 % IV SOLN
INTRAVENOUS | Status: DC
  Administered 2014-01-23: 19:00:00 via INTRAVENOUS
  Filled 2014-01-23 (×11): qty 150

## 2014-01-23 MED ORDER — MEPERIDINE HCL 100 MG/ML IJ SOLN
INTRAMUSCULAR | Status: AC
  Filled 2014-01-23: qty 2

## 2014-01-23 MED ORDER — MIDAZOLAM HCL 5 MG/5ML IJ SOLN
INTRAMUSCULAR | Status: AC
  Filled 2014-01-23: qty 10

## 2014-01-23 MED ORDER — CHLORHEXIDINE GLUCONATE 0.12 % MT SOLN
15.0000 mL | Freq: Two times a day (BID) | OROMUCOSAL | Status: DC
  Administered 2014-01-23 – 2014-01-30 (×14): 15 mL via OROMUCOSAL
  Filled 2014-01-23 (×13): qty 15

## 2014-01-23 MED ORDER — FENTANYL CITRATE 0.05 MG/ML IJ SOLN
INTRAMUSCULAR | Status: AC
  Filled 2014-01-23: qty 2

## 2014-01-23 MED ORDER — SODIUM CHLORIDE 0.9 % IV SOLN
0.0000 mg/h | INTRAVENOUS | Status: DC
  Administered 2014-01-23 – 2014-01-24 (×2): 4 mg/h via INTRAVENOUS
  Administered 2014-01-25: 3 mg/h via INTRAVENOUS
  Filled 2014-01-23 (×5): qty 10

## 2014-01-23 MED ORDER — ETOMIDATE 2 MG/ML IV SOLN
INTRAVENOUS | Status: AC
  Filled 2014-01-23: qty 20

## 2014-01-23 MED ORDER — STERILE WATER FOR IRRIGATION IR SOLN
Status: DC | PRN
  Administered 2014-01-23: 14:00:00

## 2014-01-23 MED ORDER — SODIUM CHLORIDE 0.9 % IV SOLN
1.0000 mg/h | INTRAVENOUS | Status: DC
  Filled 2014-01-23 (×2): qty 10

## 2014-01-23 MED ORDER — FENTANYL CITRATE 0.05 MG/ML IJ SOLN
100.0000 ug | INTRAMUSCULAR | Status: DC | PRN
  Administered 2014-01-23: 100 ug via INTRAVENOUS

## 2014-01-23 MED ORDER — INSULIN ASPART 100 UNIT/ML ~~LOC~~ SOLN
2.0000 [IU] | SUBCUTANEOUS | Status: DC
  Administered 2014-01-23 – 2014-01-24 (×3): 4 [IU] via SUBCUTANEOUS
  Administered 2014-01-24: 2 [IU] via SUBCUTANEOUS
  Administered 2014-01-24: 4 [IU] via SUBCUTANEOUS
  Administered 2014-01-25 – 2014-01-26 (×9): 2 [IU] via SUBCUTANEOUS
  Administered 2014-01-26: 4 [IU] via SUBCUTANEOUS
  Administered 2014-01-27 – 2014-01-28 (×7): 2 [IU] via SUBCUTANEOUS
  Administered 2014-01-28 (×2): 4 [IU] via SUBCUTANEOUS
  Administered 2014-01-28: 2 [IU] via SUBCUTANEOUS
  Administered 2014-01-28: 4 [IU] via SUBCUTANEOUS
  Administered 2014-01-29 (×2): 2 [IU] via SUBCUTANEOUS

## 2014-01-23 MED ORDER — NOREPINEPHRINE BITARTRATE 1 MG/ML IV SOLN
2.0000 ug/min | INTRAVENOUS | Status: DC
  Administered 2014-01-23: 5 ug/min via INTRAVENOUS
  Filled 2014-01-23 (×2): qty 4

## 2014-01-23 MED ORDER — OCTREOTIDE ACETATE 500 MCG/ML IJ SOLN
INTRAMUSCULAR | Status: AC
  Filled 2014-01-23: qty 1

## 2014-01-23 MED ORDER — THIAMINE HCL 100 MG/ML IJ SOLN
100.0000 mg | Freq: Every day | INTRAMUSCULAR | Status: DC
  Administered 2014-01-23 – 2014-01-28 (×6): 100 mg via INTRAVENOUS
  Filled 2014-01-23 (×6): qty 1

## 2014-01-23 MED ORDER — LIDOCAINE VISCOUS 2 % MT SOLN
OROMUCOSAL | Status: AC
  Filled 2014-01-23: qty 15

## 2014-01-23 MED ORDER — MEPERIDINE HCL 100 MG/ML IJ SOLN
INTRAMUSCULAR | Status: DC | PRN
  Administered 2014-01-23 (×2): 25 mg via INTRAVENOUS

## 2014-01-23 MED ORDER — LIDOCAINE VISCOUS 2 % MT SOLN
OROMUCOSAL | Status: DC | PRN
  Administered 2014-01-23: 2 mL via OROMUCOSAL

## 2014-01-23 MED ORDER — FENTANYL CITRATE 0.05 MG/ML IJ SOLN
100.0000 ug | INTRAMUSCULAR | Status: DC | PRN
  Filled 2014-01-23: qty 2

## 2014-01-23 MED ORDER — SODIUM CHLORIDE 0.9 % IV SOLN
1.0000 mg/h | INTRAVENOUS | Status: DC
  Administered 2014-01-23: 3 mg/h via INTRAVENOUS
  Filled 2014-01-23: qty 10

## 2014-01-23 MED ORDER — SUCCINYLCHOLINE CHLORIDE 20 MG/ML IJ SOLN
INTRAMUSCULAR | Status: AC
  Filled 2014-01-23: qty 1

## 2014-01-23 MED ORDER — MIDAZOLAM HCL 5 MG/5ML IJ SOLN
INTRAMUSCULAR | Status: DC | PRN
  Administered 2014-01-23: 2 mg via INTRAVENOUS
  Administered 2014-01-23 (×4): 1 mg via INTRAVENOUS

## 2014-01-23 MED ORDER — LIDOCAINE HCL (CARDIAC) 20 MG/ML IV SOLN
INTRAVENOUS | Status: AC
  Filled 2014-01-23: qty 5

## 2014-01-23 MED ORDER — SODIUM CHLORIDE 0.9 % IJ SOLN
10.0000 mL | INTRAMUSCULAR | Status: DC | PRN
  Administered 2014-01-23: 20 mL

## 2014-01-23 MED ORDER — ROCURONIUM BROMIDE 50 MG/5ML IV SOLN
INTRAVENOUS | Status: AC
  Filled 2014-01-23: qty 2

## 2014-01-23 MED ORDER — SUCCINYLCHOLINE CHLORIDE 20 MG/ML IJ SOLN
INTRAMUSCULAR | Status: DC | PRN
  Administered 2014-01-23: 130 mg via INTRAVENOUS

## 2014-01-23 MED ORDER — SODIUM CHLORIDE 0.9 % IJ SOLN
10.0000 mL | Freq: Two times a day (BID) | INTRAMUSCULAR | Status: DC
  Administered 2014-01-23: 30 mL
  Administered 2014-01-25 – 2014-01-26 (×4): 10 mL
  Administered 2014-01-27 – 2014-01-28 (×2): 20 mL
  Administered 2014-01-28 – 2014-01-29 (×2): 10 mL
  Administered 2014-01-29: 20 mL

## 2014-01-23 MED ORDER — SODIUM BICARBONATE 8.4 % IV SOLN: 100.0000 meq | Freq: Once | INTRAVENOUS | Status: DC

## 2014-01-23 MED ORDER — PANTOPRAZOLE SODIUM 40 MG IV SOLR
40.0000 mg | Freq: Once | INTRAVENOUS | Status: AC
  Administered 2014-01-23: 40 mg via INTRAVENOUS
  Filled 2014-01-23: qty 40

## 2014-01-23 MED ORDER — SODIUM CHLORIDE 0.9 % IV SOLN
25.0000 ug/h | INTRAVENOUS | Status: DC
  Administered 2014-01-23: 50 ug/h via INTRAVENOUS
  Administered 2014-01-25: 100 ug/h via INTRAVENOUS
  Filled 2014-01-23 (×2): qty 50

## 2014-01-23 SURGICAL SUPPLY — 19 items
BLOCK BITE 60FR ADLT L/F BLUE (MISCELLANEOUS) IMPLANT
ELECT REM PT RETURN 9FT ADLT (ELECTROSURGICAL)
ELECTRODE REM PT RTRN 9FT ADLT (ELECTROSURGICAL) IMPLANT
FLOOR PAD 36X40 (MISCELLANEOUS)
FORCEPS BIOP RAD 4 LRG CAP 4 (CUTTING FORCEPS) IMPLANT
FORMALIN 10 PREFIL 20ML (MISCELLANEOUS) IMPLANT
KIT CLEAN ENDO COMPLIANCE (KITS) IMPLANT
MANIFOLD NEPTUNE II (INSTRUMENTS) ×3 IMPLANT
NEEDLE SCLEROTHERAPY 25GX240 (NEEDLE) IMPLANT
PAD FLOOR 36X40 (MISCELLANEOUS) IMPLANT
PROBE APC STR FIRE (PROBE) IMPLANT
PROBE INJECTION GOLD (MISCELLANEOUS)
PROBE INJECTION GOLD 7FR (MISCELLANEOUS) IMPLANT
SNARE SHORT THROW 13M SML OVAL (MISCELLANEOUS) IMPLANT
SYR 50ML LL SCALE MARK (SYRINGE) IMPLANT
SYR INFLATION 60ML (SYRINGE) IMPLANT
TUBING ENDO SMARTCAP PENTAX (MISCELLANEOUS) IMPLANT
TUBING IRRIGATION ENDOGATOR (MISCELLANEOUS) IMPLANT
WATER STERILE IRR 1000ML POUR (IV SOLUTION) IMPLANT

## 2014-01-23 NOTE — Progress Notes (Signed)
RT called to room for patient cuff leak. Pt tube was last documented at 20 ATL. Pt was loosing volumes through vent. Clare called and tube advanced back to original location of 24 ATL. Chest Xray has been ordered to verify.

## 2014-01-23 NOTE — Progress Notes (Signed)
Received patient to room at 1830 via [place]. Report given on arrival to nurse by [place].

## 2014-01-23 NOTE — Progress Notes (Signed)
EGD at bedside was being done for hematemesis and anemia by Dr. Oneida Alar at approximately 1450. Pt noted to have large non-bleeding esophageal varices during procedure. Dr. Oneida Alar proceeded to band varices and then spontaneously pt starting bleeding from band coming off varice-per Dr. Oneida Alar. Copious amounts of bright red blood was seen around pt's esophageal varices, Dr. Oneida Alar ordered to have pt intubated emergently. Hall Busing, pt's ICU RN was called to pt's room emergently. I called OR to notify CRNA of emergent intubation need, but no CRNA was present in the building. Therefore I called Respiratory to intubate per Terence Lux, ICU RN. I spoke with Alethia Berthold, Nursing Supervisor, who was at bedside, to notify on-call CRNA to head to the hospital immediatey and let them know the respiratory therapist was attempting intubation. Respiratory therapist was unable to successfully intubate at 1526. Dr. Nona Dell then also attempted to intubate at 1529. Pt became asystole on monitor, no pulse was found, Code Blue was initiated and CPR started at 1535. ET tube was thought to have wrong placement, therefore it was withdrawn by Respiratory Therapist per Dr. Nona Dell order and Dr. Wilson Singer, ER MD was at bedside and re-attempted intubation successfully at 1541.  See Code Blue record for more detailed information during Code Blue. Pulse returned at 1547. Dr. Nona Dell placed Sperry tube at 1549. [facility] at bedside assisting to transport pt to [facility] unit 2100. I gave [facility] RN report concerning the EGD procedure.

## 2014-01-23 NOTE — ED Provider Notes (Signed)
Called to ICU. Pt with hematemesis. Initially came to room and then and then left as pt had just been intubated. Returned shortly later after pt coded. Being bagged on my return without ETT in place and CPR in progress. I intubated and confirmed with qualitative co2 detector and breath sounds. CPR continued with ROSC a few minutes later. Remained in room for ~20 minutes to assist as needed. Pt then with spontaneous respirations and normotensive to hypertensive. PRBCs infusing. PICC noted in LUE. Dr Oneida Alar placing [place] tube. Some sedation subsequently ordered and paralytic to facilitate assisted ventilation and place of [place] tube. Did not review CXR post-intubation as pt remained critically ill and placement of Franco Nones superceded this.  Dr Oneida Alar and Dr Roderic Palau remaining at bedside to continue resuscitation and further direct care. [facility] at bedside.   INTUBATION Performed by: Virgel Manifold  Required items: required blood products, implants, devices, and special equipment available Patient identity confirmed: provided demographic data and hospital-assigned identification number Time out: Immediately prior to procedure a "time out" was called to verify the correct patient, procedure, equipment, support staff and site/side marked as required.  Indications:  Airway protection  Intubation method: Direct laryngoscopy. MAC 3. Copious amounts of bright red blood in oropharynx.   Attempts: one  Preoxygenation: BVM  Sedatives: none Paralytic: none  Tube Size: 7.5?  Post-procedure assessment: chest rise and ETCO2 monitor Breath sounds: equal and absent over the epigastrium Tube secured with: tape and then ETT holder  Chest x-ray findings: none available immediately for review.   Patient tolerated the procedure well with no immediate complications.     Virgel Manifold, MD 2014/02/07 [phone]

## 2014-01-23 NOTE — Progress Notes (Signed)
Subjective:  Patient without complaints. Denies abdominal pain. No nausea or vomiting.  Objective: Vital signs in last 24 hours: Temp:  [97.9 F (36.6 C)-98.9 F (37.2 C)] 98.2 F (36.8 C) (05/08 0930) Pulse Rate:  [83-118] 95 (05/08 0930) Resp:  [12-26] 18 (05/08 0930) BP: (68-117)/(39-76) 71/54 mmHg (05/08 0930) SpO2:  [96 %-100 %] 100 % (05/08 0930) Weight:  [204 lb (92.534 kg)-204 lb 2.3 oz (92.6 kg)] 204 lb 2.3 oz (92.6 kg) (05/08 0500) Last BM Date: 01/20/2014 General:   Alert,  Well-developed, well-nourished, pleasant and cooperative in NAD. Wife and sister at bedside. Head:  Normocephalic and atraumatic. Eyes:  Sclera clear, no icterus.  Chest: CTA bilaterally without rales, rhonchi, crackles.    Heart:  Regular rate and rhythm; no murmurs, clicks, rubs,  or gallops. Abdomen:  Soft, nontender and nondistended. Normal bowel sounds, without guarding, and without rebound.   Extremities:  Without clubbing, deformity or edema. Neurologic:  Alert and  oriented x4;  grossly normal neurologically. Skin:  Intact without significant lesions or rashes. Psych:  Alert and cooperative. Normal mood and affect.  Intake/Output from previous day: 05/07 0701 - 05/08 0700 In: 472.5 [I.V.:125; Blood:347.5] Out: 800 [Urine:800] Intake/Output this shift: Total I/O In: 25 [Blood:25] Out: -   Lab Results: CBC  Recent Labs  02/14/2014 1545 01/17/2014 2222 2014/02/20 0442  WBC 13.1* 13.2* 15.1*  HGB 8.5* 8.1* 7.8*  HCT 25.1* 24.3* 22.5*  MCV 88.1 86.8 87.2  PLT 147* 125* 130*   BMET  Recent Labs  02/08/2014 1140 Feb 20, 2014 0442  NA 137 145  K 4.3 4.1  CL 100 113*  CO2 23 20  GLUCOSE 148* 147*  BUN 22 23  CREATININE 0.69 0.73  CALCIUM 8.9 7.8*   LFTs  Recent Labs  01/18/2014 1140 Feb 20, 2014 0442  BILITOT 1.3* 1.8*  BILIDIR 0.3  --   IBILI 1.0*  --   ALKPHOS 113 72  AST 36 25  ALT 16 11  PROT 7.0 4.9*  ALBUMIN 2.6* 2.0*   No results found for this basename: LIPASE,  in the  last 72 hours PT/INR  Recent Labs  02/14/2014 1140  LABPROT 17.0*  INR 1.42   Ammonia 154     Imaging Studies: No results found.[2 weeks]   Assessment: 63 year old woman who presented with upper GI bleed in the setting of BC powder use, daily alcohol use. History of esophageal strictures requiring dilation in the past, primary gastroenterologist is Dr. Berneta Sages. Underwent EGD yesterday and found to have 2 columns of grade 2 esophageal varices without stigmata of bleed. Sound color mucosa at the distal esophagus suggestive of Barrett's esophagus. Stomach full of clots and some blood. Examination incomplete but no bleeding lesion identified. No evidence of duodenal ulcer. Patient noted to have hypotension at time of endoscopy, received 4 mg of Versed. Remains hypotensive today. At the time I evaluated the patient, plan was for EGD in the OR, however Dr. Patsey Berthold has recommended avoiding propofol given ongoing hypotension.  Patient had a large black/bloody stool this morning. Last hemoglobin 7.8. He is scheduled to receive 1 unit of packed red blood cells. Thus far he has received one unit of blood yesterday. Second unit is hanging. He is scheduled to see receive 3 units of blood today.  Elevated ammonia level, no signs of HE.  Elevated WBC.  Plan: 1. Transfuse as needed. Keep 2 units on hold at all times. 2. Continue octreotide and protonix drip. He received a 40 mg bolus of  protonix this morning prior to starting blood transfusions given utilization needs of his IV accesses. Drip on hold during transfusions. 3. Continue ceftriaxone. 4. EGD currently on hold per anesthesiology due to hypotension. Continue resuscitation process. Levophed has been ordered. Further recommendations per Dr. Oneida Alar. Discusses situation with Dyanne Carrel, NP. 5. He will need cirrhosis workup/care as outpatient. May return to primary GI.  LOS: 1 day   Mahala Menghini  01/17/2014, 10:29 AM

## 2014-01-23 NOTE — Progress Notes (Signed)
ett pulled back 2cm, secured 20 cm at lip,repeat xray pending at this time. rr increased to 30, fio2 .60.

## 2014-01-23 NOTE — Consult Note (Signed)
Anesthesiology Note  63 YO male with GI bleed underwent upper endoscopy yesterday with 4mg  Versed and developed hypotensive episodes. Pt was scheduled for another endoscopy today with propofol sedation. Pt is still somewhat hypotensive with systolic pressures 16-38. Sedation with propofol is contraindicated until his BP normalizes and his blood loss has been adequately replaced. Propofol would cause more hemodynamic instability than than small amount of Versed he received earlier and should be avoided for the time being.   We will be glad to assist with his sedation for endoscopy after blood and fluid resuscitation is complete.
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
## 2014-01-23 NOTE — Progress Notes (Signed)
UR chart review completed.  

## 2014-01-23 NOTE — Care Management Note (Signed)
    Page 1 of 1   02/17/14     3:21:15 PM CARE MANAGEMENT NOTE Feb 17, 2014  Patient:  [name], [name]   Account Number:  [id number]  Date Initiated:  02-17-2014  Documentation initiated by:  Theophilus Kinds  Subjective/Objective Assessment:   Pt admitted from home with hematemesis. Pt lives wife and will return home at discharge. Pt is independent with ADL's.     Action/Plan:   Will continue to follow for discharge planning needs.   Anticipated DC Date:  01/26/2014   Anticipated DC Plan:  [place]  CM consult      Choice offered to / List presented to:             Status of service:  Completed, signed off Medicare Important Message given?   (If response is "NO", the following Medicare IM given date fields will be blank) Date Medicare IM given:   Date Additional Medicare IM given:    Discharge Disposition:  HOME/SELF CARE  Per UR Regulation:    If discussed at Long Length of Stay Meetings, dates discussed:    Comments:  2014-02-17 [place], RN BSN CM

## 2014-01-23 NOTE — Progress Notes (Signed)
Peripherally Inserted Central Catheter/Midline Placement  The IV Nurse has discussed with the patient and/or persons authorized to consent for the patient, the purpose of this procedure and the potential benefits and risks involved with this procedure.  The benefits include less needle sticks, lab draws from the catheter and patient may be discharged home with the catheter.  Risks include, but not limited to, infection, bleeding, blood clot (thrombus formation), and puncture of an artery; nerve damage and irregular heat beat.  Alternatives to this procedure were also discussed.  PICC/Midline Placement Documentation  PICC / Midline Double Lumen 27/25/36 PICC Right Basilic 41 cm (Active)  Indication for Insertion or Continuance of Line Vasoactive infusions;Limited venous access - need for IV therapy >5 days (PICC only) 01/28/2014 12:25 PM  Exposed Catheter (cm) 0 cm 01/22/2014 12:25 PM  Site Assessment Clean;Dry;Intact 02/09/2014 12:25 PM  Lumen #1 Status Flushed;Saline locked;Capped (Central line);Blood return noted 02/02/2014 12:25 PM  Lumen #2 Status Flushed;Saline locked;Capped (Central line);Blood return noted 02/13/2014 12:25 PM  Dressing Type Transparent 01/28/2014 12:25 PM  Dressing Status Clean;Dry;Intact 01/29/2014 12:25 PM  Dressing Change Due 02/13/2014 02/11/2014 12:25 PM       Roselind Messier 02/01/2014, 12:46 PM
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
## 2014-01-23 NOTE — Progress Notes (Signed)
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  Restricted arm stuck per Dr. Gar Gibbon.

## 2014-01-23 NOTE — Op Note (Addendum)
[facility] [address] [place], [address]   ENDOSCOPY PROCEDURE REPORT  PATIENT: Tom, Berger  MR#: [id number] BIRTHDATE: 09/15/1951 , 62  yrs. old GENDER: Male  ENDOSCOPIST: Barney Drain, MD REFERRED XN:[id number] Laural Golden, M.D. DR. [facility]  PROCEDURE DATE: 2014/02/04 PROCEDURE:   EGD w/ band ligation of varices INDICATIONS:Hematemesis YESTERDAY. INCOMPLETE EGD DUE TO HYPOTENSION MAROON/BLACK STOOLS THIS MORNING. USES  50 GOODY POWDERS A WEEK. DRINKS BEER DAILY. INR 1.42 PLT CT 123K. MEDICATIONS: Demerol 50 mg IV and Versed 6 mg IV , LEVOPHED GTT PRIOR TO EGD DUE TO SBP 80-90s. TOPICAL ANESTHETIC:   Viscous Xylocaine  DESCRIPTION OF PROCEDURE:     Physical exam was performed.  Informed consent was obtained from the patient after explaining the benefits, risks, and alternatives to the procedure.  The patient was connected to the monitor and placed in the left lateral position.  Continuous oxygen was provided by nasal cannula and IV medicine administered through an indwelling cannula.  After administration of sedation, the patients esophagus was intubated and the     endoscope was advanced under direct visualization to the second portion of the duodenum.  The scope was removed slowly by carefully examining the color, texture, anatomy, and integrity of the mucosa on the way out.  The patient was recovered in endoscopy and discharged home in satisfactory condition.   ESOPHAGUS: 4 COLUMNS OF LARGE ESOPHAGEAL VARICES.  RED WALE SIGN PRESENT.  ONE STREAK OF RED BLOOD IN DISTAL ESOPHAGUS.  5/6 BANDS DEPLOYED SUCCESSFULLY.  PT WRETCHED AND ONE BAND CAME OFF.  BRIGHT RED BLOOD SEEN IMMEDIATELY IN THE DISTAL ESOPHAGUS.   STOMACH: Moderate gastropathy was found in the entire examined stomach.   NO OLD BLOOD OR FRESH BLOOD IN STOMACH.   DUODENUM: A small sessile polyp was found in the 2nd part of the duodenum.   MULTIPLE SMALL AREAS OF ACTIVE OOZING SEEN IN SECOND  PORTION OF THE DUODENUM. AREAS IRRIGATED BUT PINPOINT AREAS OF BRIGHT RED BLOOD RE-APPEARED.               COMPLICATIONS: PT VOMITED LARGE AMOUNT OF BLOOD/CLOT. SCOPE WITHDRAWN.  NEW BANDING DEVICE PLACED. ESOPHAGUS RE-INTUBATED. 1 DDITIONAL BAND DEPLOYED. BLEEDING COULD NOT BE CONTROLLED. RESPIRATORY/ANESTHESIA CONSULTED TO INTUBATE ANESTHESIA NOT AVAILABLE. RESPIRATORY THERAPY ATTEMPTED BUT FAILED x1. PT GIVEN SUCCINYL CHOLINE 130 MG. DR. Tranesha Lessner ATTEMPTED TO INTUBATE PT. CO2 INDICATOR CHANGED COLOR. PT BAGGED INITIALLY WITH 100% SATS. LARGE AMOUNT OF BLOO DIN ET TUBE. O2 SATS DROPPED TO 21%. ET TUBE PULLED. PT BAGGED. O2 SATS INITIALLY INCREASING BUT PT DEVELOPED WENT BRADYCARDIAC. ATROPINE 1 MG IV ORDRED. AS IT WAS BEING GIVEN, PT HAD ASYSTOLE. DR. MEMON PRSENT. CHEST COMPRESSIONS STARTED. EPINEPHRINE GIVEN x2.  LEVOPHED GTT CONTINUED. PULSE RETURNED AND THEN LOST. VASOPRESSIN ORDERED. DR. Wilson Singer AND ANESTHESIA ARRIVED. DR. Wilson Singer INTUBATED PT. O2SAT MAINTAINED AT 100%. PULSE RETURNED. SBP 160s, HR 130-160. PT AGITATED AND ORDER GIVEN FOR VECURONIUM AND FENTANYL. BLAKEMORE TUBE PLACE INITIALLY WITH 200 CC IN THE BALLON. ATTEMPTS TO INFLATE ESOPHAGEAL BALLON UNSUCCESSFUL. BLAKEMORE TUBE BECAME DISLODGED. BLAKEMORE TUBE RE-INSERTED. PLACEMENT CONFIRMED VIA ASUCULTATION. GASTRIC BALLOON INFLATED WITH 200 CC AIR. TENSION HELD. GASTRIC LUMEN TO WALL SUCTION. INITIALLY LARGE AMOUNT OF BLOOD FROM STOMACH. AFTER PT STABILIZED NO BRIGHT RED BLOOD ASPIRATED VIA GASTRIC PORT OF THE BLAKEMORE TUBE.  ENDOSCOPIC IMPRESSION: 1.   GRADE III VARICES IN THE DISTAL ESOPHAGUS 2.   MODERATE  PORTAL GASTROPATHY 3.   SMALL BOWEL ENTEROPATHY 4.   Small sessile polyp in  the 2nd part of the duodenum  RECOMMENDATIONS: TRANSFER TO [facility] FOR TIPS PROCEDURE. EVENTS/MANAGEMENT PLAN DISCUSSED WITH WIFE, SISTERS(2), DR.  Roderic Palau, Loran Senters) AND DR.  HARRELL. DISCUSSED THE CRITICAL NATURE OF PT  WITH HS WIFE/ISTER(S)D EXPLAINED TIPS PROCEDURE. EXPLAINED PT ASPIRATED DURING RESUSSICTATION AND RESPIRATORY COMPLICATION MAY OCCUR. RECEIVING UNIT CONTACTED(PATRICE) LEFT CONTACT PH#S FOR ANY QUESTIONS.   REPEAT EXAM:   _______________________________ Lorrin MaisBarney Drain, MD Jan 30, 2014 8:39 PM Revised: 01/25/2014 8:39 PM      PATIENT NAME:  Tom, Berger MR#: [id number]

## 2014-01-23 NOTE — Progress Notes (Signed)
Patient seen and examined. The above note reviewed.  Patient has been admitted with GI bleeding. He had several episodes of hematemesis prior to admission. The patient reports taking large amounts of BC powder as well as drinking beer on a regular basis. He underwent EGD yesterday which could not locate a definitive source of bleeding. He was noted to be large blood and clots in the stomach. Plans are for repeat EGD today. He has had 1 unit of PRBC transfused with 2 units of PRBCs planned for this morning. He remains hypotensive with a blood pressure of 70s to 80s. However, when checked with a manual cuff, blood pressure reading was 105/70. Patient denies any dizziness or lightheadedness. Plans are for PICC line placement and initiation of vasopressors so the EGD may be completed. Will continue with volume resuscitation and PRBC transfusion. He is currently on a Protonix infusion.  His EGD did indicate esophageal varices. He likely has underlying alcoholic cirrhosis. Octreotide infusion was added to his regimen. Cirrhosis will need to be further evaluated in the outpatient setting. Ammonia is elevated in 150s, but no signs of hepatic encephalopathy or altered mental status. His elevated ammonia may be related to GI bleeding. He has been started on prophylactic Rocephin due to cirrhosis and ongoing GI bleed.   [employer]

## 2014-01-23 NOTE — Progress Notes (Signed)
Was called by Dr. Oneida Alar after endoscopy.  Patient was noted to have esophageal variceal bleeding. Banding was done to help control bleeding, but could not completely stop bleeding.  Since patient was continuing to bleed, decision was made to electively intubate for airway protection. After intubation, patient became increasingly hypoxic, subsequently bradycardic and then went into asystole. CPR was initiated. Patient received alternating doses of atropine and epinephrine x3. He also received one dose of vasopressin. Patient subsequently regained pulses, rhythm on monitor was sinus tachycardia. He currently has IV fluids and PRBCs running through the IV. He is also on norepinephrine infusion for blood pressure support. He is receiving intravenous Protonix and octreotide infusions. Case was discussed with Dr. Kieth Brightly at [facility] who is accepting the patient in transfer. Family was updated regarding his care.  [employer]

## 2014-01-23 NOTE — Progress Notes (Signed)
[facility] PROGRESS NOTE  [name]:[id number] DOB: 1951-02-02 DOA: 02/11/2014 PCP: Earney Mallet, MD  Assessment/Plan: Principal Problem:  Hematemesis/ GI bleed: likely related to  EtOH and BC powder usage. S/P EGD 02/01/2014 which revealed esophageal varices without stigmata of bleed and blood clots in stomach. No other source of active bleed was found. Octreotide infusion added to  Protonix drip. Hg this am 7.8 s/p 2 units. Transfuse 1 unit per GI. Continue serial CBCs and transfuse as indicated. Repeat EGD planned for today per GI. Rocephin day #2 (cirrhotic with GI bleed).   Active Problems:  Hypotension: SBP range 80-94. Likely volume related. IV fluids increased to 125/hr. Therapies as noted above.  Tachycardia: Related to #1. Resolved this am. Continue supportive therapy with IV fluids. Will monitor on the step down unit.    Acute blood anemia: No further episodes of hematemesis. S/p 2 units PRBC's. Transfuse 1 more unit this am per GI. Continue serial CBCs and transfuse as indicated.   Elevated ammonia: likely related to GI bleed. Pt with hx ETOH use. No s/sx confusion. Monitor closely  Elevated total bilirubin: mild. Likely related to GI bleed in setting of ETOH abuse. Monitor  HYPERTENSION, UNSPECIFIED: See above.Holding home beta blocker and Lasix.   ETOH use: drinks 3-4 beers daily reportedly. Counseled regarding cessation. Will request SW consult when acute phase of illness resolved.    CAD, NATIVE VESSEL: No chest pain or palpitations. History of CABG 2009. Holding his medications for now. He indicates he is an appointment with his cardiologist Dr. Domenic Polite next week.   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 Patient reports he has not followed up. Will recommend outpatient followup at   GERD (gastroesophageal reflux disease): See #1.   Hyperglycemia: Mild no history of diabetes. Likely reactive. Will monitor   Code  Status: full Family Communication: wife at bedside Disposition Plan:    Consultants:  Dr Laural Golden GI  Procedures: 01/29/2014 EGD revealing two columns of grade 2 esophageal varices without stigmata of bleed. Salmon colored mucosa at distal esophagus with geographic pattern suggestive of Barrett's esophagus. Small sliding hiatal hernia. Stomach full of clots and some blood. Examination incomplete but no bleeding lesion identified. No evidence of duodenal ulcer.  Antibiotics:  Rocephin 01/28/2014>>  HPI/Subjective: Awake alert. Denies pain/discomfort. Reports slept "pretty well" no further hematemesis  Objective: Filed Vitals:   2014/02/11 0715  BP:   Pulse:   Temp: 97.9 F (36.6 C)  Resp:     Intake/Output Summary (Last 24 hours) at 11-Feb-2014 5638 Last data filed at 02-11-2014 0017  Gross per 24 hour  Intake  472.5 ml  Output    800 ml  Net -327.5 ml   Filed Weights   02/08/2014 1127 02/11/14 0500  Weight: 92.534 kg (204 lb) 92.6 kg (204 lb 2.3 oz)    Exam:   General:  Well nourished NAD  Cardiovascular: RRR +murmur no gallup no rub No LE edema  Respiratory: normal effort BS clear bilaterally to auscultation no wheee  Abdomen: round non-distended sluggish BS non-tender to palpation  Musculoskeletal: no clubbing or cyanosis. Right hand with puffiness. Pulse palpable   Data Reviewed: Basic Metabolic Panel:  Recent Labs Lab 02/08/2014 1140 11-Feb-2014 0442  NA 137 145  K 4.3 4.1  CL 100 113*  CO2 23 20  GLUCOSE 148* 147*  BUN 22 23  CREATININE 0.69 0.73  CALCIUM 8.9 7.8*   Liver Function Tests:  Recent Labs Lab  01/16/2014 1140 01/16/2014 0442  AST 36 25  ALT 16 11  ALKPHOS 113 72  BILITOT 1.3* 1.8*  PROT 7.0 4.9*  ALBUMIN 2.6* 2.0*   No results found for this basename: LIPASE, AMYLASE,  in the last 168 hours  Recent Labs Lab 02/05/2014 2222  AMMONIA 154*   CBC:  Recent Labs Lab 01/21/2014 1140 01/16/2014 1545 02/13/2014 2222 01/26/2014 0442  WBC 11.4* 13.1*  13.2* 15.1*  NEUTROABS 7.7  --   --   --   HGB 10.5* 8.5* 8.1* 7.8*  HCT 31.3* 25.1* 24.3* 22.5*  MCV 88.2 88.1 86.8 87.2  PLT 184 147* 125* 130*   Cardiac Enzymes: No results found for this basename: CKTOTAL, CKMB, CKMBINDEX, TROPONINI,  in the last 168 hours BNP (last 3 results) No results found for this basename: PROBNP,  in the last 8760 hours CBG: No results found for this basename: GLUCAP,  in the last 168 hours  No results found for this or any previous visit (from the past 240 hour(s)).   Studies: No results found.  Scheduled Meds: . cefTRIAXone (ROCEPHIN)  IV  1 g Intravenous Q24H  . diphenhydrAMINE  25 mg Intravenous Once  . metoCLOPramide (REGLAN) injection  10 mg Intravenous 4 times per day  . pantoprazole (PROTONIX) IV  40 mg Intravenous Once  . sodium chloride  500 mL Intravenous Once   Continuous Infusions: . sodium chloride 125 mL/hr at 01/18/2014 1839  . octreotide (SANDOSTATIN) infusion 25 mcg/hr (01/20/2014 0357)  . pantoprozole (PROTONIX) infusion 8 mg/hr (02/04/2014 2336)    Principal Problem:   Hematemesis Active Problems:   Mixed hyperlipidemia   HYPERTENSION, UNSPECIFIED   CAD, NATIVE VESSEL   LUNG NODULE   GERD (gastroesophageal reflux disease)   Tachycardia   Anemia   Hyperglycemia   UGI bleed   Varices, esophageal   Serum total bilirubin elevated   Serum ammonia increased   Hypotension, unspecified    Time spent: 35 minutes    [facility] Pager [phone]. If 7PM-7AM, please contact night-coverage at [email or web address], password [facility] 02/04/2014, 8:32 AM  LOS: 1 day

## 2014-01-23 NOTE — Progress Notes (Signed)
[facility] Progress Note Patient Name: MOHD. DERFLINGER DOB: 30-Oct-1950 MRN: [id number]  Date of Service  02/14/2014   HPI/Events of Note   Arrived from [facility] Cirrhosis, variceal bleeding, shock Cardiac arrest before transport   eICU Interventions   Initial orders placed Labs ordered Discussed with IR - emergent TIPS indicated STAT abdominal Doppler ordered Bedside PCCM MD to see    Intervention Category Major Interventions: Hemorrhage - evaluation and management;Shock - evaluation and management  Brinnley Lacap 02/14/2014, 6:38 PM

## 2014-01-23 NOTE — Progress Notes (Signed)
REVIEWED. AGREE. 

## 2014-01-24 ENCOUNTER — Inpatient Hospital Stay (HOSPITAL_COMMUNITY): Payer: BC Managed Care – PPO

## 2014-01-24 DIAGNOSIS — R578 Other shock: Secondary | ICD-10-CM

## 2014-01-24 DIAGNOSIS — K922 Gastrointestinal hemorrhage, unspecified: Secondary | ICD-10-CM

## 2014-01-24 DIAGNOSIS — I8500 Esophageal varices without bleeding: Secondary | ICD-10-CM

## 2014-01-24 LAB — HEMOGLOBIN AND HEMATOCRIT, BLOOD
HCT: 33.2 % — ABNORMAL LOW (ref 39.0–52.0)
HCT: 31.7 % — ABNORMAL LOW (ref 39.0–52.0)
HCT: 29.5 % — ABNORMAL LOW (ref 39.0–52.0)
HCT: 28.1 % — ABNORMAL LOW (ref 39.0–52.0)
HEMOGLOBIN: 11.2 g/dL — AB (ref 13.0–17.0)
HEMOGLOBIN: 9.9 g/dL — AB (ref 13.0–17.0)
Hemoglobin: 10.9 g/dL — ABNORMAL LOW (ref 13.0–17.0)
Hemoglobin: 9.5 g/dL — ABNORMAL LOW (ref 13.0–17.0)

## 2014-01-24 LAB — POCT I-STAT 3, ART BLOOD GAS (G3+)
Acid-base deficit: 8 mmol/L — ABNORMAL HIGH (ref 0.0–2.0)
BICARBONATE: 15.7 meq/L — AB (ref 20.0–24.0)
O2 SAT: 86 %
PCO2 ART: 24.5 mmHg — AB (ref 35.0–45.0)
PH ART: 7.414 (ref 7.350–7.450)
PO2 ART: 50 mmHg — AB (ref 80.0–100.0)
TCO2: 16 mmol/L (ref 0–100)

## 2014-01-24 LAB — GLUCOSE, CAPILLARY
Glucose-Capillary: 117 mg/dL — ABNORMAL HIGH (ref 70–99)
Glucose-Capillary: 119 mg/dL — ABNORMAL HIGH (ref 70–99)
Glucose-Capillary: 123 mg/dL — ABNORMAL HIGH (ref 70–99)
Glucose-Capillary: 159 mg/dL — ABNORMAL HIGH (ref 70–99)
Glucose-Capillary: 162 mg/dL — ABNORMAL HIGH (ref 70–99)
Glucose-Capillary: 187 mg/dL — ABNORMAL HIGH (ref 70–99)

## 2014-01-24 MED ORDER — PIPERACILLIN-TAZOBACTAM 3.375 G IVPB
3.3750 g | Freq: Three times a day (TID) | INTRAVENOUS | Status: DC
  Administered 2014-01-24 – 2014-01-30 (×19): 3.375 g via INTRAVENOUS
  Filled 2014-01-24 (×23): qty 50

## 2014-01-24 MED ORDER — IOHEXOL 300 MG/ML  SOLN
150.0000 mL | Freq: Once | INTRAMUSCULAR | Status: AC | PRN
  Administered 2014-01-24: 1 mL via INTRAVENOUS

## 2014-01-24 MED ORDER — SODIUM CHLORIDE 0.9 % IV BOLUS (SEPSIS)
1000.0000 mL | Freq: Once | INTRAVENOUS | Status: AC
  Administered 2014-01-24: 1000 mL via INTRAVENOUS

## 2014-01-24 MED ORDER — LACTULOSE 10 GM/15ML PO SOLN
30.0000 g | Freq: Three times a day (TID) | ORAL | Status: DC
  Administered 2014-01-24 – 2014-01-26 (×6): 30 g via ORAL
  Filled 2014-01-24 (×8): qty 45

## 2014-01-24 MED ORDER — NOREPINEPHRINE BITARTRATE 1 MG/ML IV SOLN
2.0000 ug/min | INTRAVENOUS | Status: DC
  Administered 2014-01-24: 6 ug/min via INTRAVENOUS
  Administered 2014-01-25 – 2014-01-26 (×2): 10 ug/min via INTRAVENOUS
  Administered 2014-01-26: 8 ug/min via INTRAVENOUS
  Administered 2014-01-28: 3 ug/min via INTRAVENOUS
  Filled 2014-01-24 (×7): qty 8

## 2014-01-24 MED ORDER — PANTOPRAZOLE SODIUM 40 MG IV SOLR
40.0000 mg | INTRAVENOUS | Status: DC
  Administered 2014-01-24 – 2014-01-27 (×4): 40 mg via INTRAVENOUS
  Filled 2014-01-24 (×6): qty 40

## 2014-01-24 NOTE — Procedures (Signed)
TIPS placed US paracentesis removing 4 L ascites Portosystemic gradient decreased from 19 to 1 mmHG No variceal opacification pre or post deflation of Blakemore balloon R IJ Trialysis catheter left in place for venous access No complication No blood loss. See complete dictation in [facility].

## 2014-01-24 NOTE — Sedation Documentation (Addendum)
Portal vein post 38 33/26 (30) right atrium

## 2014-01-24 NOTE — Progress Notes (Signed)
PULMONARY / CRITICAL CARE MEDICINE  Name: [name] MRN: [id number] DOB: 1951-03-20    ADMISSION DATE:  01/25/2014 CONSULTATION DATE:  01/16/2014  REFERRING MD :  Transfer from [facility] PRIMARY SERVICE: PCCM  CHIEF COMPLAINT:  Hemorrhagic shock  BRIEF PATIENT DESCRIPTION: 63 yo with alcoholic liver cirrhosis whop resented with hematemesis to [facility]. Intubated. EGD with grade III esophageal varices, unsuccessful banding. Briefly coded, resuscitated. Transferred to [facility] for emergent TIPS by IR.  SIGNIFICANT EVENTS / STUDIES:  5/8  EGD >>> esophageal varices, unsuccessful bending 5/9  IR >>> TIPS with decrease in portal pressure, paracentesis  LINES / TUBES: OETT 5/8 >>> [place] tube 5/8 >>> Foley 5/8 >>> R PICC ??? >>> R IJ CVL 5/9 >>>  CULTURES:  ANTIBIOTICS: Zosyn 5/8  >>>   INTERVAL HISTORY: TIPS with decrease in portal pressure  VITAL SIGNS: Temp:  [97.7 F (36.5 C)-99.4 F (37.4 C)] 98.8 F (37.1 C) (05/09 1300) Pulse Rate:  [79-167] 104 (05/09 1300) Resp:  [12-66] 20 (05/09 1300) BP: (67-170)/(43-121) 99/51 mmHg (05/09 1300) SpO2:  [19 %-100 %] 98 % (05/09 1300) Arterial Line BP: (90-117)/(51-67) 98/54 mmHg (05/09 1300) FiO2 (%):  [60 %-100 %] 60 % (05/09 1140) Weight:  [98.3 kg (216 lb 11.4 oz)] 98.3 kg (216 lb 11.4 oz) (05/09 0433)  HEMODYNAMICS:   VENTILATOR SETTINGS: Vent Mode:  [-] PRVC FiO2 (%):  [60 %-100 %] 60 % Set Rate:  [14 bmp-30 bmp] 20 bmp Vt Set:  [600 mL] 600 mL PEEP:  [5 cmH20] 5 cmH20 Plateau Pressure:  [19 cmH20-23 cmH20] 20 cmH20  INTAKE / OUTPUT: Intake/Output     05/08 0701 - 05/09 0700 05/09 0701 - 05/10 0700   I.V. (mL/kg) 5664.5 (57.6) 863 (8.8)   Blood 702.5    IV Piggyback 112.5 37.5   Total Intake(mL/kg) 6479.5 (65.9) 900.5 (9.2)   Urine (mL/kg/hr) 599 (0.3) 150 (0.2)   Other  2000 (3.1)   Total Output 599 2150   Net +5880.5 -1249.5          PHYSICAL EXAMINATION: General:  Mechanically ventilated,  synchronous Neuro:  Encephalopathic / sedated, nonfocal, cough / gag diminished HEENT:  PERRL, OETT / [place] tube Cardiovascular:  RRR, no m/r/g Lungs:  Bilateral diminished air entry, no w/r/r Abdomen:  Soft, nontender, bowel sounds diminished Musculoskeletal:  No edema Skin:  Intact  LABS:  CBC  Recent Labs Lab 01/18/2014 2222 01/27/2014 0442 02/08/2014 1835 01/24/14 0200 01/24/14 0630  WBC 13.2* 15.1* 22.4*  --   --   HGB 8.1* 7.8* 11.5* 11.2* 10.9*  HCT 24.3* 22.5* 34.0* 33.2* 31.7*  PLT 125* 130* 89*  --   --    Coag's  Recent Labs Lab 01/20/2014 1140 02/03/2014 1835  INR 1.42 1.60*   BMET  Recent Labs Lab 02/15/2014 1140 01/27/2014 0442 01/26/2014 1835  NA 137 145 146  K 4.3 4.1 4.0  CL 100 113* 113*  CO2 23 20 20   BUN 22 23 22   CREATININE 0.69 0.73 0.82  GLUCOSE 148* 147* 209*   Electrolytes  Recent Labs Lab 01/27/2014 1140 02/13/2014 0442 01/25/2014 1835  CALCIUM 8.9 7.8* 7.3*   Sepsis Markers No results found for this basename: LATICACIDVEN, PROCALCITON, O2SATVEN,  in the last 168 hours ABG  Recent Labs Lab 02/12/2014 1618 02/09/2014 1900  PHART 7.138* 7.346*  PCO2ART 48.1* 33.8*  PO2ART 145.0* 82.0   Liver Enzymes  Recent Labs Lab 02/12/2014 1140 01/26/2014 0442 02/10/2014 1835  AST 36 25 51*  ALT 16 11 19   ALKPHOS 113 72 81  BILITOT 1.3* 1.8* 3.5*  ALBUMIN 2.6* 2.0* 2.1*   Cardiac Enzymes No results found for this basename: TROPONINI, PROBNP,  in the last 168 hours Glucose  Recent Labs Lab 01/26/2014 1959 02/05/2014 2120 01/24/14 0003 01/24/14 0413 01/24/14 0717 01/24/14 1201  GLUCAP 206* 196* 187* 162* 159* 123*   IMAGING:  [place] Art/ven Flow Abd Pelv Doppler  01/19/2014   CLINICAL DATA:  Planned for tips.  Evaluate portal vein.  EXAM: DUPLEX ULTRASOUND OF LIVER  TECHNIQUE: Color and duplex Doppler ultrasound was performed to evaluate the hepatic in-flow and out-flow vessels.  COMPARISON:  None.  FINDINGS: Portal Vein Velocities  Main:  22  cm/sec  Right:  20 cm/sec  Left:  20 cm/sec  Hepatic Vein Velocities  Right:  Approximately 50 cm/sec  Middle:  Patent by color Doppler.  Spectral tracing not obtained.  Left:  25 cm/sec  Hepatic Artery Velocity:  216 cm/sec  Splenic Vein:  Status post splenectomy  Varices: None visualized  Ascites: Moderate simple appearing ascites present.  Flow in the portal and hepatic venous systems is antegrade. Flow in the hepatic venous system is monophasic, commonly seen with cirrhosis. Despite the elevated systolic velocities within hepatic artery, the waveform appears low resistance. Tachycardia.  IMPRESSION: Patent and antegrade portal and hepatic venous system, as above.   Electronically Signed   By: Jorje Guild M.D.   On: 02/01/2014 21:41   Ir Fluoro Guide Cv Line Right  01/24/2014   CLINICAL DATA:  Acute esophageal variceal bleed, inadequately controlled endoscopically.  PROCEDURE: ULTRASOUND-GUIDED PARACENTESIS  TRANSJUGULAR INTRAHEPATIC PORTOSYSTEMIC SHUNT  RIGHT IJ CENTRAL VENOUS CATHETER PLACEMENT  COMPARISON:  Ultrasound 02/15/2014  Operators:  Leafy Half  TECHNIQUE: Survey ultrasound of the liver was performed and an appropriate skin entry site was determined. Site was marked, prepped with Betadine, draped in usual sterile fashion, infiltrated locally with 1% lidocaine. Under real-time ultrasound guidance, a 5 [place] multi side hole you sheath needle was placed into the right upper quadrant and peritoneal cavity to initiate paracentesis.  Under real-time ultrasound guidance, a 21 gauge micropuncture needle was advanced to in peripheral anterior right lobe branch of the a portal vein. A 018 guidewire advanced centrally easily. The needle was exchanged for 3 [place] dilator. Portal venography was performed. The portal vein was shown to be patent with antegrade hepatopetal flow. A guide wire was advanced through the percutaneous microdilator into the portal vein as a target.  The right neck prepped with  chlorhexidine, draped in usual sterile fashion. The right IJ vein was accessed under real time ultrasound guidance with a 21-gauge micropuncture needle, exchanged over a 0.018 guide wire for the transitional dilator which allowed advancement of a Bentson wire into the IVC. Over this, an angled 10-French vascular sheath was advanced. A 5-French angled angiographic catheter was coaxially advanced and used to catheterize the right hepatic vein. The angiographic catheter was exchanged for the [place] needle and sheath, which were used to create an intrahepatic tract from the right hepatic vein to the right portal vein under biplane fluoroscopic guidance using 2 passes. Contrast injection confirmed portal vein access at an appropriate location. A Bentson wire advanced easily into the portal vein. The needle was exchanged for a 4 French straight catheter, advanced into the main portal vein. Portal vein and right atrial pressures were obtained. A portosystemic gradient of 83mmHg was recorded. The Catheter was exchanged for a 7 mm Conquest angioplasty balloon, used  predilate the tract . The 10 mm x 60 mm Viatorr covered stent was then advanced across the track and deployed from the right portal vein to the right hepatic vein. The stent was dilated with a 10 mm balloon. Portal venography was performed and pressure measurements were obtained. A portasystemic gradient of 1 mmHg was recorded. There is no filling of varices from the portosplenic confluence. All visualized tributaries to the portal vein were selectively catheterized with a 5 [place] MPA catheter and selective venography performed pre and post deflation of the Blakemore balloon. No variceal filling identified.  There is diminished antegrade flow into peripheral portal venous branches. The percutaneous portal venous guidewire was removed. The right IJ sheath was exchanged over an Amplatz wire for a 20 cm trialysis catheter, positioned with its tip at the  cavoatrial junction. Fluoroscopic spot image obtained. Catheter secured externally with 0 Prolene suture. The patient tolerated the procedure well.  FLUOROSCOPY TIME:  17 min 40 seconds  IMPRESSION: 1. Ultrasound-guided paracentesis removing 4 L clear yellow ascites. 2. Technically successful TIPS creation, decreasing the portosystemic gradient from 19 to 1 mmHg. 3. Right IJ triple-lumen central venous catheter placement, OK for routine use.   Electronically Signed   By: Arne Cleveland M.D.   On: 01/24/2014 12:23   Ir Tips  01/24/2014   CLINICAL DATA:  Acute esophageal variceal bleed, inadequately controlled endoscopically.  PROCEDURE: ULTRASOUND-GUIDED PARACENTESIS  TRANSJUGULAR INTRAHEPATIC PORTOSYSTEMIC SHUNT  RIGHT IJ CENTRAL VENOUS CATHETER PLACEMENT  COMPARISON:  Ultrasound 02/13/2014  Operators:  Leafy Half  TECHNIQUE: Survey ultrasound of the liver was performed and an appropriate skin entry site was determined. Site was marked, prepped with Betadine, draped in usual sterile fashion, infiltrated locally with 1% lidocaine. Under real-time ultrasound guidance, a 5 [place] multi side hole you sheath needle was placed into the right upper quadrant and peritoneal cavity to initiate paracentesis.  Under real-time ultrasound guidance, a 21 gauge micropuncture needle was advanced to in peripheral anterior right lobe branch of the a portal vein. A 018 guidewire advanced centrally easily. The needle was exchanged for 3 [place] dilator. Portal venography was performed. The portal vein was shown to be patent with antegrade hepatopetal flow. A guide wire was advanced through the percutaneous microdilator into the portal vein as a target.  The right neck prepped with chlorhexidine, draped in usual sterile fashion. The right IJ vein was accessed under real time ultrasound guidance with a 21-gauge micropuncture needle, exchanged over a 0.018 guide wire for the transitional dilator which allowed advancement of a Bentson  wire into the IVC. Over this, an angled 10-French vascular sheath was advanced. A 5-French angled angiographic catheter was coaxially advanced and used to catheterize the right hepatic vein. The angiographic catheter was exchanged for the [place] needle and sheath, which were used to create an intrahepatic tract from the right hepatic vein to the right portal vein under biplane fluoroscopic guidance using 2 passes. Contrast injection confirmed portal vein access at an appropriate location. A Bentson wire advanced easily into the portal vein. The needle was exchanged for a 4 French straight catheter, advanced into the main portal vein. Portal vein and right atrial pressures were obtained. A portosystemic gradient of 58mmHg was recorded. The Catheter was exchanged for a 7 mm Conquest angioplasty balloon, used predilate the tract . The 10 mm x 60 mm Viatorr covered stent was then advanced across the track and deployed from the right portal vein to the right hepatic vein. The stent was dilated  with a 10 mm balloon. Portal venography was performed and pressure measurements were obtained. A portasystemic gradient of 1 mmHg was recorded. There is no filling of varices from the portosplenic confluence. All visualized tributaries to the portal vein were selectively catheterized with a 5 [place] MPA catheter and selective venography performed pre and post deflation of the Blakemore balloon. No variceal filling identified.  There is diminished antegrade flow into peripheral portal venous branches. The percutaneous portal venous guidewire was removed. The right IJ sheath was exchanged over an Amplatz wire for a 20 cm trialysis catheter, positioned with its tip at the cavoatrial junction. Fluoroscopic spot image obtained. Catheter secured externally with 0 Prolene suture. The patient tolerated the procedure well.  FLUOROSCOPY TIME:  17 min 40 seconds  IMPRESSION: 1. Ultrasound-guided paracentesis removing 4 L clear yellow  ascites. 2. Technically successful TIPS creation, decreasing the portosystemic gradient from 19 to 1 mmHg. 3. Right IJ triple-lumen central venous catheter placement, OK for routine use.   Electronically Signed   By: Arne Cleveland M.D.   On: 01/24/2014 12:23   Dg Chest Port 1 View  2014-02-17   CLINICAL DATA:  Status post intubation  EXAM: PORTABLE CHEST - 1 VIEW  COMPARISON:  DG CHEST 2 VIEW dated 12/25/2008; DG CHEST 2 VIEW dated 12/03/2008; DG CHEST 1V PORT dated 12/02/2008  FINDINGS: Patient is status post prior CABG. An NG tube extends into the stomach. There is a right-sided PICC line with tip over the region of the cavoatrial junction. An endotracheal tube has been placed with tip at the proximal right mainstem bronchus.  There is vascular congestion and bilateral perihilar hazy opacity. There is also hazy right upper lobe opacity.  IMPRESSION: 1. Endotracheal tube extends into right mainstem bronchus by about 2-2.5 cm. Recommend repositioning the 2 been repeating radiographs. 2. Findings suggest pulmonary edema 3. Opacity right upper lobe possibly atelectasis. Other possibilities include asymmetric edema or pneumonitis. Critical Value/emergent results were called by telephone at the time of interpretation on 02/17/14 at 4:49 PM to Dr. Clearence Cheek in Respiratory Therapy , who verbally acknowledged these results.   Electronically Signed   By: Skipper Cliche M.D.   On: 02-17-14 16:50   Dg Chest Port 1v Same Day  2014-02-17   CLINICAL DATA:  Acute respiratory failure. On ventilator. Repositioning of endotracheal tube. Coronary artery disease.  EXAM: PORTABLE CHEST - 1 VIEW SAME DAY  COMPARISON:  Prior today  FINDINGS: The endotracheal tube has been pulled back with tip now approximately 4.5 cm above the carina. Orogastric tube is again seen entering the stomach. Right arm PICC line tip is seen overlying the proximal right atrium.  The right lateral lung field is not included within the field-of-view. Diffuse  interstitial edema pattern shows no significant change. Heart size is stable. Low lung volumes again noted.  IMPRESSION: Endotracheal tube tip approximately 4.5 cm above the carina. No other significant interval change.   Electronically Signed   By: Earle Gell M.D.   On: 02/17/14 17:07   ASSESSMENT / PLAN:  PULMONARY A: Acute respiratory failure Aspiration pneumonia / pneumonitis Difficult intubation P:   Goal pH>7.30, SpO2>92 Continuous mechanical support VAP bundle Daily SBT Trend ABG/CXR Albuterol / Atrovent  CARDIOVASCULAR A:  Hypovolemic / hemorrhagic shock, secondary to variceal bleed, improved Cardiac arrest in setting of hemorrhagic shock, ROSC after 12 minutes of CPR P:  Goal MAP > 65 D/c Levophed gtt  RENAL A:   No active issues P:   Trend  BMP NS@150  D/c bicarbonate gtt  GASTROINTESTINAL A:   Alcoholic cirrhosis Variceal hemorrhage s/p unsuccessfully bending and TIPS  GI Px Nutrition P:   NPO D/c [place] tube Continue Octreotide gtt x 24 more hours D/c Protonix gtt Protonix daily  HEMATOLOGIC A:   Acute blood loss anemia Coagulopathy VTE Px P:  Trend CBC / INR SCD  INFECTIOUS A:   Possible aspiration pneumonia At risk for SBP P:   Abx as above  ENDOCRINE A:   Hyperglycemia No h/o DM P:   Glycemic protocol Phase 1  NEUROLOGIC A:   Alcohol abuse Acute encephalopathy At risk for hepatic encephalopathy P:   Fentanyl / Versed gtt Thiamin / Folate Lactulose   I have personally obtained history, examined patient, evaluated and interpreted laboratory and imaging results, reviewed medical records, formulated assessment / plan and placed orders.  CRITICAL CARE:  The patient is critically ill with multiple organ systems failure and requires high complexity decision making for assessment and support, frequent evaluation and titration of therapies, application of advanced monitoring technologies and extensive interpretation of  multiple databases. Critical Care Time devoted to patient care services described in this note is 35 minutes.   Doree Fudge, MD Pulmonary and [place] Pager: [phone]  01/24/2014, 2:22 PM
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
## 2014-01-24 NOTE — Progress Notes (Signed)
[facility] Progress Note Patient Name: JATHAN BALLING DOB: Oct 11, 1950 MRN: [id number]  Date of Service  01/24/2014   HPI/Events of Note   Progressively hypotensive. Orders given by floor MDs for saline and Levophed. HgB subsequently decreased from 9.9 to 9.5.   eICU Interventions   Continue will plan by floor team. Continue serial CBCs.   Intervention Category Major Interventions: Hypotension - evaluation and management  Mariea Clonts 01/24/2014, 6:41 PM

## 2014-01-24 NOTE — Sedation Documentation (Signed)
Pt transported on vent and monitor to IR suite with RN and RT. Pt infusing versed at 4 mg/hr, fentanyl at 67mcg/hr, octirotide at 25 mcg/hr, ns at 132ml/hr, and zosyn 3.375g at 12.5 ml/hr.

## 2014-01-24 NOTE — Sedation Documentation (Signed)
Portal vein mean 31 Right artrium  15/11/(12)

## 2014-01-24 NOTE — Sedation Documentation (Addendum)
33/26 (30) right atrium

## 2014-01-24 NOTE — Sedation Documentation (Signed)
Blakemore tube gastric balloon released per MD/

## 2014-01-24 NOTE — Procedures (Signed)
Arterial Catheter Insertion Procedure Note JENKINS RISDON [id number] November 10, 1950  Procedure: Insertion of Arterial Catheter  Indications: Blood pressure monitoring  Procedure Details Consent: Risks of procedure as well as the alternatives and risks of each were explained to the (patient/caregiver).  Consent for procedure obtained. Time Out: Verified patient identification, verified procedure, site/side was marked, verified correct patient position, special equipment/implants available, medications/allergies/relevent history reviewed, required imaging and test results available.  Performed  Maximum sterile technique was used including antiseptics, cap, gloves, gown, hand hygiene, mask and sheet. Skin prep: Chlorhexidine; local anesthetic administered 22 gauge catheter was inserted into right radial artery using the Seldinger technique.  Evaluation Blood flow good; BP tracing good. Complications: No apparent complications.   Sharen Hint 01/24/2014
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
## 2014-01-25 ENCOUNTER — Inpatient Hospital Stay (HOSPITAL_COMMUNITY): Payer: BC Managed Care – PPO

## 2014-01-25 DIAGNOSIS — J690 Pneumonitis due to inhalation of food and vomit: Secondary | ICD-10-CM

## 2014-01-25 DIAGNOSIS — R579 Shock, unspecified: Secondary | ICD-10-CM

## 2014-01-25 DIAGNOSIS — K920 Hematemesis: Secondary | ICD-10-CM

## 2014-01-25 DIAGNOSIS — J9600 Acute respiratory failure, unspecified whether with hypoxia or hypercapnia: Secondary | ICD-10-CM

## 2014-01-25 LAB — BASIC METABOLIC PANEL
BUN: 22 mg/dL (ref 6–23)
CALCIUM: 7.2 mg/dL — AB (ref 8.4–10.5)
CHLORIDE: 120 meq/L — AB (ref 96–112)
CO2: 19 mEq/L (ref 19–32)
CREATININE: 0.99 mg/dL (ref 0.50–1.35)
GFR calc non Af Amer: 85 mL/min — ABNORMAL LOW (ref >90)
Glucose, Bld: 148 mg/dL — ABNORMAL HIGH (ref 70–99)
Potassium: 3.6 mEq/L — ABNORMAL LOW (ref 3.7–5.3)
Sodium: 149 mEq/L — ABNORMAL HIGH (ref 137–147)

## 2014-01-25 LAB — GLUCOSE, CAPILLARY
Glucose-Capillary: 123 mg/dL — ABNORMAL HIGH (ref 70–99)
Glucose-Capillary: 129 mg/dL — ABNORMAL HIGH (ref 70–99)
Glucose-Capillary: 136 mg/dL — ABNORMAL HIGH (ref 70–99)
Glucose-Capillary: 136 mg/dL — ABNORMAL HIGH (ref 70–99)
Glucose-Capillary: 136 mg/dL — ABNORMAL HIGH (ref 70–99)
Glucose-Capillary: 142 mg/dL — ABNORMAL HIGH (ref 70–99)

## 2014-01-25 LAB — HEPATIC FUNCTION PANEL
ALK PHOS: 63 U/L (ref 39–117)
ALT: 21 U/L (ref 0–53)
AST: 49 U/L — ABNORMAL HIGH (ref 0–37)
Albumin: 1.8 g/dL — ABNORMAL LOW (ref 3.5–5.2)
BILIRUBIN DIRECT: 1.4 mg/dL — AB (ref 0.0–0.3)
BILIRUBIN INDIRECT: 0.9 mg/dL (ref 0.3–0.9)
Total Bilirubin: 2.3 mg/dL — ABNORMAL HIGH (ref 0.3–1.2)
Total Protein: 4.5 g/dL — ABNORMAL LOW (ref 6.0–8.3)

## 2014-01-25 LAB — CORTISOL: Cortisol, Plasma: 15.6 ug/dL

## 2014-01-25 LAB — LACTIC ACID, PLASMA
LACTIC ACID, VENOUS: 1.6 mmol/L (ref 0.5–2.2)
Lactic Acid, Venous: 0.2 mmol/L — ABNORMAL LOW (ref 0.5–2.2)

## 2014-01-25 LAB — HEMOGLOBIN AND HEMATOCRIT, BLOOD
HCT: 27.7 % — ABNORMAL LOW (ref 39.0–52.0)
HCT: 27 % — ABNORMAL LOW (ref 39.0–52.0)
HCT: 27.6 % — ABNORMAL LOW (ref 39.0–52.0)
HEMOGLOBIN: 9.2 g/dL — AB (ref 13.0–17.0)
HEMOGLOBIN: 9 g/dL — AB (ref 13.0–17.0)
Hemoglobin: 9 g/dL — ABNORMAL LOW (ref 13.0–17.0)

## 2014-01-25 LAB — AMMONIA: AMMONIA: 80 umol/L — AB (ref 11–60)

## 2014-01-25 MED ORDER — FENTANYL CITRATE 0.05 MG/ML IJ SOLN
100.0000 ug | INTRAMUSCULAR | Status: DC | PRN
  Administered 2014-01-25 – 2014-01-29 (×20): 100 ug via INTRAVENOUS
  Filled 2014-01-25 (×21): qty 2

## 2014-01-25 MED ORDER — MIDAZOLAM HCL 2 MG/2ML IJ SOLN
2.0000 mg | INTRAMUSCULAR | Status: DC | PRN
  Administered 2014-01-25 – 2014-01-29 (×12): 2 mg via INTRAVENOUS
  Filled 2014-01-25 (×11): qty 2

## 2014-01-25 MED ORDER — HEPARIN SODIUM (PORCINE) 1000 UNIT/ML IJ SOLN
3000.0000 [IU] | Freq: Once | INTRAMUSCULAR | Status: AC
  Administered 2014-01-25: 2800 [IU]
  Filled 2014-01-25 (×2): qty 3

## 2014-01-25 MED ORDER — POTASSIUM CHLORIDE 20 MEQ/15ML (10%) PO LIQD
40.0000 meq | ORAL | Status: AC
  Administered 2014-01-25 (×2): 40 meq
  Filled 2014-01-25 (×2): qty 30

## 2014-01-25 NOTE — Progress Notes (Signed)
PULMONARY / CRITICAL CARE MEDICINE  Name: [name] MRN: [id number] DOB: 12-12-1950    ADMISSION DATE:  01/26/2014 CONSULTATION DATE:  02/11/2014  REFERRING MD :  Transfer from [facility] PRIMARY SERVICE: PCCM  CHIEF COMPLAINT:  Hemorrhagic shock  BRIEF PATIENT DESCRIPTION: 63 yo with alcoholic liver cirrhosis whop resented with hematemesis to [facility]. Intubated. EGD with grade III esophageal varices, unsuccessful banding. Briefly coded, resuscitated. Transferred to [facility] for emergent TIPS by IR.  SIGNIFICANT EVENTS / STUDIES:  5/8  EGD >>> esophageal varices, unsuccessful bending 5/9  IR >>> TIPS with decrease in portal pressure, paracentesis  LINES / TUBES: OETT 5/8 >>> [place] tube 5/8 >>> 5/9 Foley 5/8 >>> R PICC ??? >>> R IJ CVL 5/9 >>>  CULTURES: 5/8  MRSA PCR >>> neg  ANTIBIOTICS: Zosyn 5/8  >>>   INTERVAL HISTORY: Hypotensive overnight requiring vasopressors.  VITAL SIGNS: Temp:  [98.5 F (36.9 C)-100.6 F (38.1 C)] 99.9 F (37.7 C) (05/10 1030) Pulse Rate:  [83-107] 90 (05/10 1100) Resp:  [19-23] 20 (05/10 1100) BP: (79-146)/(41-74) 146/51 mmHg (05/10 1100) SpO2:  [88 %-99 %] 96 % (05/10 1100) Arterial Line BP: (64-141)/(37-64) 134/61 mmHg (05/10 1100) FiO2 (%):  [60 %-70 %] 70 % (05/10 1000)  HEMODYNAMICS: CVP:  [2 mmHg-16 mmHg] 16 mmHg  VENTILATOR SETTINGS: Vent Mode:  [-] PRVC FiO2 (%):  [60 %-70 %] 70 % Set Rate:  [20 bmp] 20 bmp Vt Set:  [600 mL] 600 mL PEEP:  [5 cmH20] 5 cmH20 Plateau Pressure:  [20 cmH20-25 cmH20] 25 cmH20  INTAKE / OUTPUT: Intake/Output     05/09 0701 - 05/10 0700 05/10 0701 - 05/11 0700   I.V. (mL/kg) 4310.3 (43.8) 974.4 (9.9)   Blood     NG/GT  110   IV Piggyback 1137.5    Total Intake(mL/kg) 5447.8 (55.4) 1084.4 (11)   Urine (mL/kg/hr) 760 (0.3) 60 (0.1)   Emesis/NG output  300 (0.6)   Other 2000 (0.8)    Total Output 2760 360   Net +2687.8 +724.4          PHYSICAL EXAMINATION: General:  Mechanically  ventilated, synchronous Neuro:  Opens eyes to stimulation, attempts top communicate HEENT:  PERRL, OETT / OGT Cardiovascular:  RRR, no m/r/g Lungs:  Bilateral diminished air entry, few rhonchi Abdomen:  Soft, nontender, bowel sounds diminished Musculoskeletal:  Trace edema Skin:  Intact  LABS:  CBC  Recent Labs Lab 01/21/2014 2222 02/13/2014 0442 02/11/2014 1835  01/25/14 0340 01/25/14 0710 01/25/14 1120  WBC 13.2* 15.1* 22.4*  --   --   --   --   HGB 8.1* 7.8* 11.5*  < > 9.2* 9.0* 9.0*  HCT 24.3* 22.5* 34.0*  < > 27.7* 27.0* 27.6*  PLT 125* 130* 89*  --   --   --   --   < > = values in this interval not displayed. Coag's  Recent Labs Lab 01/26/2014 1140 01/25/2014 1835  INR 1.42 1.60*   BMET  Recent Labs Lab 01/25/2014 0442 01/21/2014 1835 01/25/14 0748  NA 145 146 149*  K 4.1 4.0 3.6*  CL 113* 113* 120*  CO2 20 20 19   BUN 23 22 22   CREATININE 0.73 0.82 0.99  GLUCOSE 147* 209* 148*   Electrolytes  Recent Labs Lab 02/12/2014 0442 02/10/2014 1835 01/25/14 0748  CALCIUM 7.8* 7.3* 7.2*   Sepsis Markers No results found for this basename: LATICACIDVEN, PROCALCITON, O2SATVEN,  in the last 168 hours ABG  Recent Labs Lab 01/20/2014  1618 01/16/2014 1900 01/24/14 1827  PHART 7.138* 7.346* 7.414  PCO2ART 48.1* 33.8* 24.5*  PO2ART 145.0* 82.0 50.0*   Liver Enzymes  Recent Labs Lab 01/17/2014 0442 02/03/2014 1835 01/25/14 0951  AST 25 51* 49*  ALT 11 19 21   ALKPHOS 72 81 63  BILITOT 1.8* 3.5* 2.3*  ALBUMIN 2.0* 2.1* 1.8*   Cardiac Enzymes No results found for this basename: TROPONINI, PROBNP,  in the last 168 hours Glucose  Recent Labs Lab 01/24/14 1510 01/24/14 2115 01/25/14 0021 01/25/14 0349 01/25/14 0707 01/25/14 1123  GLUCAP 117* 119* 136* 136* 142* 136*   IMAGING:  [place] Art/ven Flow Abd Pelv Doppler  02/15/2014   CLINICAL DATA:  Planned for tips.  Evaluate portal vein.  EXAM: DUPLEX ULTRASOUND OF LIVER  TECHNIQUE: Color and duplex Doppler ultrasound  was performed to evaluate the hepatic in-flow and out-flow vessels.  COMPARISON:  None.  FINDINGS: Portal Vein Velocities  Main:  22 cm/sec  Right:  20 cm/sec  Left:  20 cm/sec  Hepatic Vein Velocities  Right:  Approximately 50 cm/sec  Middle:  Patent by color Doppler.  Spectral tracing not obtained.  Left:  25 cm/sec  Hepatic Artery Velocity:  216 cm/sec  Splenic Vein:  Status post splenectomy  Varices: None visualized  Ascites: Moderate simple appearing ascites present.  Flow in the portal and hepatic venous systems is antegrade. Flow in the hepatic venous system is monophasic, commonly seen with cirrhosis. Despite the elevated systolic velocities within hepatic artery, the waveform appears low resistance. Tachycardia.  IMPRESSION: Patent and antegrade portal and hepatic venous system, as above.   Electronically Signed   By: Jorje Guild M.D.   On: 02/12/2014 21:41   Ir Fluoro Guide Cv Line Right  01/24/2014   CLINICAL DATA:  Acute esophageal variceal bleed, inadequately controlled endoscopically.  PROCEDURE: ULTRASOUND-GUIDED PARACENTESIS  TRANSJUGULAR INTRAHEPATIC PORTOSYSTEMIC SHUNT  RIGHT IJ CENTRAL VENOUS CATHETER PLACEMENT  COMPARISON:  Ultrasound 02/01/2014  Operators:  Leafy Half  TECHNIQUE: Survey ultrasound of the liver was performed and an appropriate skin entry site was determined. Site was marked, prepped with Betadine, draped in usual sterile fashion, infiltrated locally with 1% lidocaine. Under real-time ultrasound guidance, a 5 [place] multi side hole you sheath needle was placed into the right upper quadrant and peritoneal cavity to initiate paracentesis.  Under real-time ultrasound guidance, a 21 gauge micropuncture needle was advanced to in peripheral anterior right lobe branch of the a portal vein. A 018 guidewire advanced centrally easily. The needle was exchanged for 3 [place] dilator. Portal venography was performed. The portal vein was shown to be patent with antegrade hepatopetal  flow. A guide wire was advanced through the percutaneous microdilator into the portal vein as a target.  The right neck prepped with chlorhexidine, draped in usual sterile fashion. The right IJ vein was accessed under real time ultrasound guidance with a 21-gauge micropuncture needle, exchanged over a 0.018 guide wire for the transitional dilator which allowed advancement of a Bentson wire into the IVC. Over this, an angled 10-French vascular sheath was advanced. A 5-French angled angiographic catheter was coaxially advanced and used to catheterize the right hepatic vein. The angiographic catheter was exchanged for the [place] needle and sheath, which were used to create an intrahepatic tract from the right hepatic vein to the right portal vein under biplane fluoroscopic guidance using 2 passes. Contrast injection confirmed portal vein access at an appropriate location. A Bentson wire advanced easily into the portal vein. The needle was  exchanged for a 4 French straight catheter, advanced into the main portal vein. Portal vein and right atrial pressures were obtained. A portosystemic gradient of 5mmHg was recorded. The Catheter was exchanged for a 7 mm Conquest angioplasty balloon, used predilate the tract . The 10 mm x 60 mm Viatorr covered stent was then advanced across the track and deployed from the right portal vein to the right hepatic vein. The stent was dilated with a 10 mm balloon. Portal venography was performed and pressure measurements were obtained. A portasystemic gradient of 1 mmHg was recorded. There is no filling of varices from the portosplenic confluence. All visualized tributaries to the portal vein were selectively catheterized with a 5 [place] MPA catheter and selective venography performed pre and post deflation of the Blakemore balloon. No variceal filling identified.  There is diminished antegrade flow into peripheral portal venous branches. The percutaneous portal venous guidewire was  removed. The right IJ sheath was exchanged over an Amplatz wire for a 20 cm trialysis catheter, positioned with its tip at the cavoatrial junction. Fluoroscopic spot image obtained. Catheter secured externally with 0 Prolene suture. The patient tolerated the procedure well.  FLUOROSCOPY TIME:  17 min 40 seconds  IMPRESSION: 1. Ultrasound-guided paracentesis removing 4 L clear yellow ascites. 2. Technically successful TIPS creation, decreasing the portosystemic gradient from 19 to 1 mmHg. 3. Right IJ triple-lumen central venous catheter placement, OK for routine use.   Electronically Signed   By: Arne Cleveland M.D.   On: 01/24/2014 12:23   Ir Tips  01/24/2014   CLINICAL DATA:  Acute esophageal variceal bleed, inadequately controlled endoscopically.  PROCEDURE: ULTRASOUND-GUIDED PARACENTESIS  TRANSJUGULAR INTRAHEPATIC PORTOSYSTEMIC SHUNT  RIGHT IJ CENTRAL VENOUS CATHETER PLACEMENT  COMPARISON:  Ultrasound 02/15/2014  Operators:  Leafy Half  TECHNIQUE: Survey ultrasound of the liver was performed and an appropriate skin entry site was determined. Site was marked, prepped with Betadine, draped in usual sterile fashion, infiltrated locally with 1% lidocaine. Under real-time ultrasound guidance, a 5 [place] multi side hole you sheath needle was placed into the right upper quadrant and peritoneal cavity to initiate paracentesis.  Under real-time ultrasound guidance, a 21 gauge micropuncture needle was advanced to in peripheral anterior right lobe branch of the a portal vein. A 018 guidewire advanced centrally easily. The needle was exchanged for 3 [place] dilator. Portal venography was performed. The portal vein was shown to be patent with antegrade hepatopetal flow. A guide wire was advanced through the percutaneous microdilator into the portal vein as a target.  The right neck prepped with chlorhexidine, draped in usual sterile fashion. The right IJ vein was accessed under real time ultrasound guidance with a  21-gauge micropuncture needle, exchanged over a 0.018 guide wire for the transitional dilator which allowed advancement of a Bentson wire into the IVC. Over this, an angled 10-French vascular sheath was advanced. A 5-French angled angiographic catheter was coaxially advanced and used to catheterize the right hepatic vein. The angiographic catheter was exchanged for the [place] needle and sheath, which were used to create an intrahepatic tract from the right hepatic vein to the right portal vein under biplane fluoroscopic guidance using 2 passes. Contrast injection confirmed portal vein access at an appropriate location. A Bentson wire advanced easily into the portal vein. The needle was exchanged for a 4 French straight catheter, advanced into the main portal vein. Portal vein and right atrial pressures were obtained. A portosystemic gradient of 37mmHg was recorded. The Catheter was exchanged for a 7  mm Conquest angioplasty balloon, used predilate the tract . The 10 mm x 60 mm Viatorr covered stent was then advanced across the track and deployed from the right portal vein to the right hepatic vein. The stent was dilated with a 10 mm balloon. Portal venography was performed and pressure measurements were obtained. A portasystemic gradient of 1 mmHg was recorded. There is no filling of varices from the portosplenic confluence. All visualized tributaries to the portal vein were selectively catheterized with a 5 [place] MPA catheter and selective venography performed pre and post deflation of the Blakemore balloon. No variceal filling identified.  There is diminished antegrade flow into peripheral portal venous branches. The percutaneous portal venous guidewire was removed. The right IJ sheath was exchanged over an Amplatz wire for a 20 cm trialysis catheter, positioned with its tip at the cavoatrial junction. Fluoroscopic spot image obtained. Catheter secured externally with 0 Prolene suture. The patient tolerated the  procedure well.  FLUOROSCOPY TIME:  17 min 40 seconds  IMPRESSION: 1. Ultrasound-guided paracentesis removing 4 L clear yellow ascites. 2. Technically successful TIPS creation, decreasing the portosystemic gradient from 19 to 1 mmHg. 3. Right IJ triple-lumen central venous catheter placement, OK for routine use.   Electronically Signed   By: Arne Cleveland M.D.   On: 01/24/2014 12:23   Dg Chest Port 1 View  01/25/2014   CLINICAL DATA:  GI bleed, post TIPS.  Hypotension.  EXAM: PORTABLE CHEST - 1 VIEW  COMPARISON:  the previous day's study  FINDINGS: Heart size remains normal. Hazy opacity at the left lung base probably layering effusion. Right lung clear. Endotracheal tube, right IJ triple-lumen catheter, and right arm PICC stable in position. The Blakemore tube has been removed. Nasogastric tube loops in the stomach. Previous CABG. Visualized skeletal structures are unremarkable.  IMPRESSION: 1. Removal of Blakemore and placement of nasogastric tube as above. 2. Otherwise stable appearance since previous exam   Electronically Signed   By: Arne Cleveland M.D.   On: 01/25/2014 09:36   Dg Chest Port 1 View  01/24/2014   CLINICAL DATA:  Evaluation of to ribs  EXAM: PORTABLE CHEST - 1 VIEW  COMPARISON:  IR FLUORO GUIDE CV LINE*R* dated 01/24/2014; DG CHEST PORT 1VSAME DAY dated 01/21/2014; DG CHEST 1V PORT dated 02/13/2014  FINDINGS: Endotracheal tube is 4.2 cm above the carinal. There is dual lumen right internal jugular central line with tip just proximal to the cavoatrial junction. There is a right PICC line with tip to the cavoatrial junction. NG tube is in unchanged position. There is increased hazy density at the left lung base suggesting pleural effusion and consolidation.  IMPRESSION: Lines and tubes as described. Increased hazy density left lung base.   Electronically Signed   By: Skipper Cliche M.D.   On: 01/24/2014 14:02   Dg Chest Port 1 View  01/19/2014   CLINICAL DATA:  Status post intubation  EXAM:  PORTABLE CHEST - 1 VIEW  COMPARISON:  DG CHEST 2 VIEW dated 12/25/2008; DG CHEST 2 VIEW dated 12/03/2008; DG CHEST 1V PORT dated 12/02/2008  FINDINGS: Patient is status post prior CABG. An NG tube extends into the stomach. There is a right-sided PICC line with tip over the region of the cavoatrial junction. An endotracheal tube has been placed with tip at the proximal right mainstem bronchus.  There is vascular congestion and bilateral perihilar hazy opacity. There is also hazy right upper lobe opacity.  IMPRESSION: 1. Endotracheal tube extends into right mainstem  bronchus by about 2-2.5 cm. Recommend repositioning the 2 been repeating radiographs. 2. Findings suggest pulmonary edema 3. Opacity right upper lobe possibly atelectasis. Other possibilities include asymmetric edema or pneumonitis. Critical Value/emergent results were called by telephone at the time of interpretation on 01/16/2014 at 4:49 PM to Dr. Clearence Cheek in Respiratory Therapy , who verbally acknowledged these results.   Electronically Signed   By: Skipper Cliche M.D.   On: 01/20/2014 16:50   Dg Chest Port 1v Same Day  02/01/2014   CLINICAL DATA:  Acute respiratory failure. On ventilator. Repositioning of endotracheal tube. Coronary artery disease.  EXAM: PORTABLE CHEST - 1 VIEW SAME DAY  COMPARISON:  Prior today  FINDINGS: The endotracheal tube has been pulled back with tip now approximately 4.5 cm above the carina. Orogastric tube is again seen entering the stomach. Right arm PICC line tip is seen overlying the proximal right atrium.  The right lateral lung field is not included within the field-of-view. Diffuse interstitial edema pattern shows no significant change. Heart size is stable. Low lung volumes again noted.  IMPRESSION: Endotracheal tube tip approximately 4.5 cm above the carina. No other significant interval change.   Electronically Signed   By: Earle Gell M.D.   On: 01/29/2014 17:07   Dg Abd Portable 1v  01/24/2014   CLINICAL DATA:  NG  tube placement  EXAM: PORTABLE ABDOMEN - 1 VIEW  COMPARISON:  None.  FINDINGS: An enteric tube is appreciated tracking along the expected course of the stomach. Air within nondilated loops of visualized bowel. Visualized osseous structures unremarkable.  IMPRESSION: Enteric tube curled in the expected course the stomach.   Electronically Signed   By: Margaree Mackintosh M.D.   On: 01/24/2014 18:35   ASSESSMENT / PLAN:  PULMONARY A: Acute respiratory failure Aspiration pneumonia / pneumonitis Difficult intubation P:   Goal pH>7.30, SpO2>92 Continuous mechanical support VAP bundle Daily SBT Trend ABG/CXR Albuterol / Atrovent  CARDIOVASCULAR A:  Hypovolemic / hemorrhagic / septic shock Cardiac arrest in setting of hemorrhagic shock, ROSC after 12 minutes of CPR P:  Goal MAP > 65 Levophed gtt Trend lactate  RENAL A:   Hypokalemia P:   Trend BMP NS@150  Start CVP  GASTROINTESTINAL A:   Alcoholic cirrhosis Variceal hemorrhage s/p unsuccessfully bending and successful TIPS  GI Px Nutrition P:   NPO D/c Octreotide Protonix daily Start TF  HEMATOLOGIC A:   Acute blood loss anemia Coagulopathy VTE Px P:  Trend CBC / INR SCD  INFECTIOUS A:   Possible aspiration pneumonia At risk for SBP P:   Abx as above  ENDOCRINE A:   Hyperglycemia No h/o DM Unknown adrenal function P:   Glycemic protocol Phase 1 Cortisol level  NEUROLOGIC A:   Alcohol abuse Acute encephalopathy At risk for hepatic encephalopathy P:   D/c Fentanyl / Versed gtt Start Fentanyl / Versed PRN Thiamin / Folate Lactulose   I have personally obtained history, examined patient, evaluated and interpreted laboratory and imaging results, reviewed medical records, formulated assessment / plan and placed orders.  CRITICAL CARE:  The patient is critically ill with multiple organ systems failure and requires high complexity decision making for assessment and support, frequent evaluation and  titration of therapies, application of advanced monitoring technologies and extensive interpretation of multiple databases. Critical Care Time devoted to patient care services described in this note is 35 minutes.   Doree Fudge, MD Pulmonary and [place] Pager: [phone]  01/25/2014, 12:24 PM

## 2014-01-25 NOTE — Progress Notes (Signed)
233mL IV Fentanyl and 37mL IV Versed wasted in sink.  Witnessed by Rip Harbour, Therapist, sports.

## 2014-01-25 NOTE — Progress Notes (Signed)
Subjective: Bleeding esophageal varices s/p unsuccessful banding s/p successful TIPS 01/24/14 with decrease in portal pressure. Patient is still intubated, sedation has been discontinued per family who is in the room today.   Objective: Physical Exam: BP 139/52  Pulse 83  Temp(Src) 99.9 F (37.7 C) (Core (Comment))  Resp 20  Ht 5\' 6"  (1.676 m)  Wt 216 lb 11.4 oz (98.3 kg)  BMI 34.99 kg/m2  SpO2 96%  General: Intubated, OG with dark blood Neck: right trialysis intact, no hematoma or active bleeding.  Heart: RRR without m/g/r Lungs: Diminished bilaterally.  Abd: Soft, ND  Labs: CBC  Recent Labs  02/14/2014 0442 01/25/2014 1835  01/25/14 0340 01/25/14 0710  WBC 15.1* 22.4*  --   --   --   HGB 7.8* 11.5*  < > 9.2* 9.0*  HCT 22.5* 34.0*  < > 27.7* 27.0*  PLT 130* 89*  --   --   --   < > = values in this interval not displayed. BMET  Recent Labs  01/16/2014 1835 01/25/14 0748  NA 146 149*  K 4.0 3.6*  CL 113* 120*  CO2 20 19  GLUCOSE 209* 148*  BUN 22 22  CREATININE 0.82 0.99  CALCIUM 7.3* 7.2*   LFT  Recent Labs  01/25/14 0951  PROT 4.5*  ALBUMIN 1.8*  AST 49*  ALT 21  ALKPHOS 63  BILITOT 2.3*  BILIDIR 1.4*  IBILI 0.9   PT/INR  Recent Labs  01/25/2014 1140 01/16/2014 1835  LABPROT 17.0* 18.6*  INR 1.42 1.60*     Studies/Results: [place] Art/ven Flow Abd Pelv Doppler  01/28/2014   CLINICAL DATA:  Planned for tips.  Evaluate portal vein.  EXAM: DUPLEX ULTRASOUND OF LIVER  TECHNIQUE: Color and duplex Doppler ultrasound was performed to evaluate the hepatic in-flow and out-flow vessels.  COMPARISON:  None.  FINDINGS: Portal Vein Velocities  Main:  22 cm/sec  Right:  20 cm/sec  Left:  20 cm/sec  Hepatic Vein Velocities  Right:  Approximately 50 cm/sec  Middle:  Patent by color Doppler.  Spectral tracing not obtained.  Left:  25 cm/sec  Hepatic Artery Velocity:  216 cm/sec  Splenic Vein:  Status post splenectomy  Varices: None visualized  Ascites: Moderate simple  appearing ascites present.  Flow in the portal and hepatic venous systems is antegrade. Flow in the hepatic venous system is monophasic, commonly seen with cirrhosis. Despite the elevated systolic velocities within hepatic artery, the waveform appears low resistance. Tachycardia.  IMPRESSION: Patent and antegrade portal and hepatic venous system, as above.   Electronically Signed   By: Jorje Guild M.D.   On: 02/11/2014 21:41   Ir Fluoro Guide Cv Line Right  01/24/2014   CLINICAL DATA:  Acute esophageal variceal bleed, inadequately controlled endoscopically.  PROCEDURE: ULTRASOUND-GUIDED PARACENTESIS  TRANSJUGULAR INTRAHEPATIC PORTOSYSTEMIC SHUNT  RIGHT IJ CENTRAL VENOUS CATHETER PLACEMENT  COMPARISON:  Ultrasound 02/05/2014  Operators:  Leafy Half  TECHNIQUE: Survey ultrasound of the liver was performed and an appropriate skin entry site was determined. Site was marked, prepped with Betadine, draped in usual sterile fashion, infiltrated locally with 1% lidocaine. Under real-time ultrasound guidance, a 5 [place] multi side hole you sheath needle was placed into the right upper quadrant and peritoneal cavity to initiate paracentesis.  Under real-time ultrasound guidance, a 21 gauge micropuncture needle was advanced to in peripheral anterior right lobe branch of the a portal vein. A 018 guidewire advanced centrally easily. The needle was exchanged for 3 [place] dilator.  Portal venography was performed. The portal vein was shown to be patent with antegrade hepatopetal flow. A guide wire was advanced through the percutaneous microdilator into the portal vein as a target.  The right neck prepped with chlorhexidine, draped in usual sterile fashion. The right IJ vein was accessed under real time ultrasound guidance with a 21-gauge micropuncture needle, exchanged over a 0.018 guide wire for the transitional dilator which allowed advancement of a Bentson wire into the IVC. Over this, an angled 10-French vascular  sheath was advanced. A 5-French angled angiographic catheter was coaxially advanced and used to catheterize the right hepatic vein. The angiographic catheter was exchanged for the [place] needle and sheath, which were used to create an intrahepatic tract from the right hepatic vein to the right portal vein under biplane fluoroscopic guidance using 2 passes. Contrast injection confirmed portal vein access at an appropriate location. A Bentson wire advanced easily into the portal vein. The needle was exchanged for a 4 French straight catheter, advanced into the main portal vein. Portal vein and right atrial pressures were obtained. A portosystemic gradient of 80mmHg was recorded. The Catheter was exchanged for a 7 mm Conquest angioplasty balloon, used predilate the tract . The 10 mm x 60 mm Viatorr covered stent was then advanced across the track and deployed from the right portal vein to the right hepatic vein. The stent was dilated with a 10 mm balloon. Portal venography was performed and pressure measurements were obtained. A portasystemic gradient of 1 mmHg was recorded. There is no filling of varices from the portosplenic confluence. All visualized tributaries to the portal vein were selectively catheterized with a 5 [place] MPA catheter and selective venography performed pre and post deflation of the Blakemore balloon. No variceal filling identified.  There is diminished antegrade flow into peripheral portal venous branches. The percutaneous portal venous guidewire was removed. The right IJ sheath was exchanged over an Amplatz wire for a 20 cm trialysis catheter, positioned with its tip at the cavoatrial junction. Fluoroscopic spot image obtained. Catheter secured externally with 0 Prolene suture. The patient tolerated the procedure well.  FLUOROSCOPY TIME:  17 min 40 seconds  IMPRESSION: 1. Ultrasound-guided paracentesis removing 4 L clear yellow ascites. 2. Technically successful TIPS creation, decreasing the  portosystemic gradient from 19 to 1 mmHg. 3. Right IJ triple-lumen central venous catheter placement, OK for routine use.   Electronically Signed   By: Arne Cleveland M.D.   On: 01/24/2014 12:23   Ir Tips  01/24/2014   CLINICAL DATA:  Acute esophageal variceal bleed, inadequately controlled endoscopically.  PROCEDURE: ULTRASOUND-GUIDED PARACENTESIS  TRANSJUGULAR INTRAHEPATIC PORTOSYSTEMIC SHUNT  RIGHT IJ CENTRAL VENOUS CATHETER PLACEMENT  COMPARISON:  Ultrasound 02/03/2014  Operators:  Leafy Half  TECHNIQUE: Survey ultrasound of the liver was performed and an appropriate skin entry site was determined. Site was marked, prepped with Betadine, draped in usual sterile fashion, infiltrated locally with 1% lidocaine. Under real-time ultrasound guidance, a 5 [place] multi side hole you sheath needle was placed into the right upper quadrant and peritoneal cavity to initiate paracentesis.  Under real-time ultrasound guidance, a 21 gauge micropuncture needle was advanced to in peripheral anterior right lobe branch of the a portal vein. A 018 guidewire advanced centrally easily. The needle was exchanged for 3 [place] dilator. Portal venography was performed. The portal vein was shown to be patent with antegrade hepatopetal flow. A guide wire was advanced through the percutaneous microdilator into the portal vein as a target.  The right  neck prepped with chlorhexidine, draped in usual sterile fashion. The right IJ vein was accessed under real time ultrasound guidance with a 21-gauge micropuncture needle, exchanged over a 0.018 guide wire for the transitional dilator which allowed advancement of a Bentson wire into the IVC. Over this, an angled 10-French vascular sheath was advanced. A 5-French angled angiographic catheter was coaxially advanced and used to catheterize the right hepatic vein. The angiographic catheter was exchanged for the Col[place]into needle and sheath, which were used to create an intrahepatic tract from  the right hepatic vein to the right portal vein under biplane fluoroscopic guidance using 2 passes. Contrast injection confirmed portal vein access at an appropriate location. A Bentson wire advanced easily into the portal vein. The needle was exchanged for a 4 French straight catheter, advanced into the main portal vein. Portal vein and right atrial pressures were obtained. A portosystemic gradient of 82mmHg was recorded. The Catheter was exchanged for a 7 mm Conquest angioplasty balloon, used predilate the tract . The 10 mm x 60 mm Viatorr covered stent was then advanced across the track and deployed from the right portal vein to the right hepatic vein. The stent was dilated with a 10 mm balloon. Portal venography was performed and pressure measurements were obtained. A portasystemic gradient of 1 mmHg was recorded. There is no filling of varices from the portosplenic confluence. All visualized tributaries to the portal vein were selectively catheterized with a 5 [place] MPA catheter and selective venography performed pre and post deflation of the Blakemore balloon. No variceal filling identified.  There is diminished antegrade flow into peripheral portal venous branches. The percutaneous portal venous guidewire was removed. The right IJ sheath was exchanged over an Amplatz wire for a 20 cm trialysis catheter, positioned with its tip at the cavoatrial junction. Fluoroscopic spot image obtained. Catheter secured externally with 0 Prolene suture. The patient tolerated the procedure well.  FLUOROSCOPY TIME:  17 min 40 seconds  IMPRESSION: 1. Ultrasound-guided paracentesis removing 4 L clear yellow ascites. 2. Technically successful TIPS creation, decreasing the portosystemic gradient from 19 to 1 mmHg. 3. Right IJ triple-lumen central venous catheter placement, OK for routine use.   Electronically Signed   By: Arne Cleveland M.D.   On: 01/24/2014 12:23   Dg Chest Port 1 View  01/25/2014   CLINICAL DATA:  GI bleed,  post TIPS.  Hypotension.  EXAM: PORTABLE CHEST - 1 VIEW  COMPARISON:  the previous day's study  FINDINGS: Heart size remains normal. Hazy opacity at the left lung base probably layering effusion. Right lung clear. Endotracheal tube, right IJ triple-lumen catheter, and right arm PICC stable in position. The Blakemore tube has been removed. Nasogastric tube loops in the stomach. Previous CABG. Visualized skeletal structures are unremarkable.  IMPRESSION: 1. Removal of Blakemore and placement of nasogastric tube as above. 2. Otherwise stable appearance since previous exam   Electronically Signed   By: Arne Cleveland M.D.   On: 01/25/2014 09:36   Dg Chest Port 1 View  01/24/2014   CLINICAL DATA:  Evaluation of to ribs  EXAM: PORTABLE CHEST - 1 VIEW  COMPARISON:  IR FLUORO GUIDE CV LINE*R* dated 01/24/2014; DG CHEST PORT 1VSAME DAY dated 02/09/2014; DG CHEST 1V PORT dated 02/13/2014  FINDINGS: Endotracheal tube is 4.2 cm above the carinal. There is dual lumen right internal jugular central line with tip just proximal to the cavoatrial junction. There is a right PICC line with tip to the cavoatrial junction. NG tube is  in unchanged position. There is increased hazy density at the left lung base suggesting pleural effusion and consolidation.  IMPRESSION: Lines and tubes as described. Increased hazy density left lung base.   Electronically Signed   By: Skipper Cliche M.D.   On: 01/24/2014 14:02   Dg Chest Port 1 View  02/15/2014   CLINICAL DATA:  Status post intubation  EXAM: PORTABLE CHEST - 1 VIEW  COMPARISON:  DG CHEST 2 VIEW dated 12/25/2008; DG CHEST 2 VIEW dated 12/03/2008; DG CHEST 1V PORT dated 12/02/2008  FINDINGS: Patient is status post prior CABG. An NG tube extends into the stomach. There is a right-sided PICC line with tip over the region of the cavoatrial junction. An endotracheal tube has been placed with tip at the proximal right mainstem bronchus.  There is vascular congestion and bilateral perihilar hazy  opacity. There is also hazy right upper lobe opacity.  IMPRESSION: 1. Endotracheal tube extends into right mainstem bronchus by about 2-2.5 cm. Recommend repositioning the 2 been repeating radiographs. 2. Findings suggest pulmonary edema 3. Opacity right upper lobe possibly atelectasis. Other possibilities include asymmetric edema or pneumonitis. Critical Value/emergent results were called by telephone at the time of interpretation on 02/14/2014 at 4:49 PM to Dr. Clearence Cheek in Respiratory Therapy , who verbally acknowledged these results.   Electronically Signed   By: Skipper Cliche M.D.   On: 02/12/2014 16:50   Dg Chest Port 1v Same Day  02/09/2014   CLINICAL DATA:  Acute respiratory failure. On ventilator. Repositioning of endotracheal tube. Coronary artery disease.  EXAM: PORTABLE CHEST - 1 VIEW SAME DAY  COMPARISON:  Prior today  FINDINGS: The endotracheal tube has been pulled back with tip now approximately 4.5 cm above the carina. Orogastric tube is again seen entering the stomach. Right arm PICC line tip is seen overlying the proximal right atrium.  The right lateral lung field is not included within the field-of-view. Diffuse interstitial edema pattern shows no significant change. Heart size is stable. Low lung volumes again noted.  IMPRESSION: Endotracheal tube tip approximately 4.5 cm above the carina. No other significant interval change.   Electronically Signed   By: Earle Gell M.D.   On: 01/18/2014 17:07   Dg Abd Portable 1v  01/24/2014   CLINICAL DATA:  NG tube placement  EXAM: PORTABLE ABDOMEN - 1 VIEW  COMPARISON:  None.  FINDINGS: An enteric tube is appreciated tracking along the expected course of the stomach. Air within nondilated loops of visualized bowel. Visualized osseous structures unremarkable.  IMPRESSION: Enteric tube curled in the expected course the stomach.   Electronically Signed   By: Margaree Mackintosh M.D.   On: 01/24/2014 18:35    Assessment/Plan: Bleeding esophageal varices s/p  unsuccessful banding S/p successful TIPS 01/24/14 with decrease in portal pressure. S/p cardiac arrest, resuscitated.  Hgb trending down, OG with dark blood, no source of new bleeding, follow CBC Hypotension on levophed Acute respiratory failure, intubated Hepatic panel and ammonia 80 (154) trending down, on lactulose and octreotide.  IR will follow.     LOS: 3 days    Hedy Jacob PA-C 01/25/2014 10:45 AM

## 2014-01-26 ENCOUNTER — Encounter: Payer: Self-pay | Admitting: Cardiology

## 2014-01-26 ENCOUNTER — Inpatient Hospital Stay (HOSPITAL_COMMUNITY): Payer: BC Managed Care – PPO

## 2014-01-26 ENCOUNTER — Encounter: Payer: BC Managed Care – PPO | Admitting: Cardiology

## 2014-01-26 LAB — TYPE AND SCREEN
ABO/RH(D): O POS
Antibody Screen: NEGATIVE
UNIT DIVISION: 0
UNIT DIVISION: 0
Unit division: 0
Unit division: 0
Unit division: 0
Unit division: 0
Unit division: 0
Unit division: 0

## 2014-01-26 LAB — POCT I-STAT 3, ART BLOOD GAS (G3+)
Acid-base deficit: 5 mmol/L — ABNORMAL HIGH (ref 0.0–2.0)
Bicarbonate: 19.3 mEq/L — ABNORMAL LOW (ref 20.0–24.0)
O2 Saturation: 98 %
PCO2 ART: 32 mmHg — AB (ref 35.0–45.0)
PH ART: 7.39 (ref 7.350–7.450)
Patient temperature: 99.4
TCO2: 20 mmol/L (ref 0–100)
pO2, Arterial: 108 mmHg — ABNORMAL HIGH (ref 80.0–100.0)

## 2014-01-26 LAB — BASIC METABOLIC PANEL
BUN: 14 mg/dL (ref 6–23)
CHLORIDE: 118 meq/L — AB (ref 96–112)
CO2: 19 mEq/L (ref 19–32)
Calcium: 7.4 mg/dL — ABNORMAL LOW (ref 8.4–10.5)
Creatinine, Ser: 0.84 mg/dL (ref 0.50–1.35)
GFR calc Af Amer: >90 mL/min (ref >90)
GFR calc non Af Amer: >90 mL/min (ref >90)
GLUCOSE: 126 mg/dL — AB (ref 70–99)
Potassium: 3.4 mEq/L — ABNORMAL LOW (ref 3.7–5.3)
Sodium: 147 mEq/L (ref 137–147)

## 2014-01-26 LAB — CBC
HEMATOCRIT: 28.1 % — AB (ref 39.0–52.0)
HEMOGLOBIN: 9 g/dL — AB (ref 13.0–17.0)
MCH: 29.8 pg (ref 26.0–34.0)
MCHC: 32 g/dL (ref 30.0–36.0)
MCV: 93 fL (ref 78.0–100.0)
Platelets: 92 10*3/uL — ABNORMAL LOW (ref 150–400)
RBC: 3.02 MIL/uL — ABNORMAL LOW (ref 4.22–5.81)
RDW: 17.9 % — ABNORMAL HIGH (ref 11.5–15.5)
WBC: 15.4 10*3/uL — AB (ref 4.0–10.5)

## 2014-01-26 LAB — GLUCOSE, CAPILLARY
GLUCOSE-CAPILLARY: 107 mg/dL — AB (ref 70–99)
GLUCOSE-CAPILLARY: 119 mg/dL — AB (ref 70–99)
Glucose-Capillary: 122 mg/dL — ABNORMAL HIGH (ref 70–99)
Glucose-Capillary: 122 mg/dL — ABNORMAL HIGH (ref 70–99)
Glucose-Capillary: 127 mg/dL — ABNORMAL HIGH (ref 70–99)
Glucose-Capillary: 132 mg/dL — ABNORMAL HIGH (ref 70–99)

## 2014-01-26 LAB — PHOSPHORUS
Phosphorus: 1.6 mg/dL — ABNORMAL LOW (ref 2.3–4.6)
Phosphorus: 2 mg/dL — ABNORMAL LOW (ref 2.3–4.6)

## 2014-01-26 LAB — LACTIC ACID, PLASMA: Lactic Acid, Venous: 1.4 mmol/L (ref 0.5–2.2)

## 2014-01-26 LAB — MAGNESIUM: Magnesium: 1.5 mg/dL (ref 1.5–2.5)

## 2014-01-26 MED ORDER — DEXTROSE 5 % IV SOLN
24.0000 mmol | Freq: Once | INTRAVENOUS | Status: AC
  Administered 2014-01-26: 24 mmol via INTRAVENOUS
  Filled 2014-01-26: qty 8

## 2014-01-26 MED ORDER — VITAL HIGH PROTEIN PO LIQD
1000.0000 mL | ORAL | Status: DC
  Administered 2014-01-26 (×4)
  Administered 2014-01-26: 1000 mL
  Administered 2014-01-26 – 2014-01-27 (×2)
  Administered 2014-01-27 – 2014-01-28 (×2): 1000 mL
  Filled 2014-01-26 (×6): qty 1000

## 2014-01-26 MED ORDER — MAGNESIUM SULFATE 40 MG/ML IJ SOLN
2.0000 g | Freq: Once | INTRAMUSCULAR | Status: AC
  Administered 2014-01-26: 2 g via INTRAVENOUS
  Filled 2014-01-26: qty 50

## 2014-01-26 MED ORDER — ALBUMIN HUMAN 25 % IV SOLN
25.0000 g | Freq: Once | INTRAVENOUS | Status: AC
  Administered 2014-01-26: 25 g via INTRAVENOUS
  Filled 2014-01-26: qty 100

## 2014-01-26 MED ORDER — LACTULOSE 10 GM/15ML PO SOLN
30.0000 g | Freq: Four times a day (QID) | ORAL | Status: DC
  Administered 2014-01-26 – 2014-01-30 (×13): 30 g via ORAL
  Filled 2014-01-26 (×21): qty 45

## 2014-01-26 NOTE — Progress Notes (Signed)
NUTRITION FOLLOW UP / CONSULT  Intervention:    Utilize 72M PEPuP Protocol: initiate TF via OGT with Vital High Protein at 25 ml/h on day 1; on day 2, increase to goal rate of 65 ml/h (1560 ml per day) to provide 1560 kcals (24 kcals/kg ideal weight), 137 gm protein, 1304 ml free water daily.  Nutrition Dx:   Inadequate oral intake related to inability to eat as evidenced by NPO status, ongoing.  Goal:   Enteral nutrition to provide 60-70% of estimated calorie needs (22-25 kcals/kg ideal body weight) and 100% of estimated protein needs, based on ASPEN guidelines for permissive underfeeding in critically ill obese individuals, progressing.  Monitor:   TF tolerance/adequacy, weight trend, labs, vent status.  Assessment:   Patient is a 63 y.o. male with a past medical history that includes CAD status post CABG 2009, hyperlipidemia, EtOH, presented to the emergency department on 5/7 with the chief complaint of bloody emesis x3 since 1 AM that morning.   Patient is currently intubated on ventilator support MV: 11.6 L/min Temp (24hrs), Avg:99.9 F (37.7 C), Min:99.5 F (37.5 C), Max:100.2 F (37.9 C)   Received MD Consult for TF initiation and management. S/P TIPS with paracentesis on 5/9. Receiving lactulose. Unable to complete nutrition focused physical exam at this time. Weight is fluctuating with fluid status.  Hypokalemia, hypomagnesemia, and hypophosphatemia and are being repleted.   Height: Ht Readings from Last 1 Encounters:  02/06/2014 5\' 6"  (1.676 m)    Weight Status:   Wt Readings from Last 1 Encounters:  01/26/14 223 lb 15.8 oz (101.6 kg)  01/24/14  216 lb 11.4 oz (98.3 kg)  Weight fluctuating with fluid status; unsure of dry weight.  Re-estimated needs:  Kcal: 2135 Protein: >/= 130 gm Fluid: 2.1 L  Skin: puncture wounds on neck  Diet Order: NPO   Intake/Output Summary (Last 24 hours) at 01/26/14 1118 Last data filed at 01/26/14 0636  Gross per 24 hour  Intake  3640.2 ml  Output   2545 ml  Net 1095.2 ml    Last BM: 5/8   Labs:   Recent Labs Lab 02-03-2014 1835 01/25/14 0748 01/26/14 0031  NA 146 149* 147  K 4.0 3.6* 3.4*  CL 113* 120* 118*  CO2 20 19 19   BUN 22 22 14   CREATININE 0.82 0.99 0.84  CALCIUM 7.3* 7.2* 7.4*  MG  --   --  1.5  PHOS  --   --  1.6*  GLUCOSE 209* 148* 126*    CBG (last 3)   Recent Labs  01/25/14 2353 01/26/14 0345 01/26/14 0743  GLUCAP 132* 107* 119*    Scheduled Meds: . albumin human  25 g Intravenous Once  . antiseptic oral rinse  15 mL Mouth Rinse QID  . chlorhexidine  15 mL Mouth Rinse BID  . folic acid  1 mg Intravenous Daily  . insulin aspart  2-6 Units Subcutaneous 6 times per day  . lactulose  30 g Oral Q6H  . magnesium sulfate 1 - 4 g bolus IVPB  2 g Intravenous Once  . pantoprazole (PROTONIX) IV  40 mg Intravenous Q24H  . piperacillin-tazobactam (ZOSYN)  IV  3.375 g Intravenous 3 times per day  . potassium phosphate IVPB (mmol)  24 mmol Intravenous Once  . sodium chloride  10-40 mL Intracatheter Q12H  . thiamine  100 mg Intravenous Daily    Continuous Infusions: . sodium chloride 150 mL/hr at 01/26/14 0840  . norepinephrine (LEVOPHED) Adult infusion  10 mcg/min (01/26/14 0636)    Molli Barrows, RD, LDN, [place] Pager [phone] After Hours Pager [phone]

## 2014-01-26 NOTE — Progress Notes (Signed)
Canceled   This encounter was created in error - please disregard.

## 2014-01-26 NOTE — Progress Notes (Signed)
Stable post TIPS.  Will continue to follow.  TIPS ultrasound in 4 weeks.

## 2014-01-26 NOTE — Progress Notes (Addendum)
3 Days Post-Op  Subjective: TIPS 5/9 4 liters ascites also removed Pt still intubated Agitated   Objective: Vital signs in last 24 hours: Temp:  [99.5 F (37.5 C)-100.2 F (37.9 C)] 99.7 F (37.6 C) (05/11 0600) Pulse Rate:  [74-98] 98 (05/11 0600) Resp:  [18-21] 20 (05/11 0600) BP: (97-139)/(45-89) 139/67 mmHg (05/11 0600) SpO2:  [94 %-99 %] 95 % (05/11 0600) Arterial Line BP: (90-120)/(43-52) 113/49 mmHg (05/10 2300) FiO2 (%):  [50 %-70 %] 50 % (05/11 0755) Weight:  [101.6 kg (223 lb 15.8 oz)] 101.6 kg (223 lb 15.8 oz) (05/11 0456) Last BM Date: 01/21/2014 (per charting)  Intake/Output from previous day: 05/10 0701 - 05/11 0700 In: 4724.6 [I.V.:4094.6; NG/GT:480; IV Piggyback:150] Out: 2980 [Urine:1555; Emesis/NG output:1425] Intake/Output this shift:    PE:  T max 100.1; now 99.7 BP stable Wbc 15.4 H/H stable 9.3/28.5 Site at Rt IJ no bleeding; clean  abd soft; minimally distended No further bleeding reported (hematemesis)  Lab Results:   Recent Labs  01/28/2014 1835  01/25/14 1120 01/26/14 0031  WBC 22.4*  --   --  15.4*  HGB 11.5*  < > 9.0* 9.0*  HCT 34.0*  < > 27.6* 28.1*  PLT 89*  --   --  92*  < > = values in this interval not displayed. BMET  Recent Labs  01/25/14 0748 01/26/14 0031  NA 149* 147  K 3.6* 3.4*  CL 120* 118*  CO2 19 19  GLUCOSE 148* 126*  BUN 22 14  CREATININE 0.99 0.84  CALCIUM 7.2* 7.4*   PT/INR  Recent Labs  01/22/2014 1835  LABPROT 18.6*  INR 1.60*   ABG  Recent Labs  01/24/14 1827 01/26/14 0412  PHART 7.414 7.390  HCO3 15.7* 19.3*    Studies/Results: Dg Chest Port 1 View  01/26/2014   CLINICAL DATA:  Endotracheal tube  EXAM: PORTABLE CHEST - 1 VIEW  COMPARISON:  DG CHEST 1V PORT dated 01/25/2014; DG CHEST 1V PORT dated 01/24/2014; DG CHEST PORT 1VSAME DAY dated 02/06/2014  FINDINGS: Grossly unchanged cardiac silhouette and mediastinal contours post median sternotomy and CABG. Stable position of support apparatus. No  pneumothorax. Pulmonary vasculature appears less distinct than present examination with cephalization of flow. Worsening bilateral mid and lower lung heterogeneous opacities. Trace bilateral effusions are suspected. Grossly unchanged bones.  IMPRESSION: 1.  Stable positioning of support apparatus.  No pneumothorax. 2. Worsening pulmonary edema and bilateral mid/lower lung opacities, atelectasis versus infiltrate.   Electronically Signed   By: Sandi Mariscal M.D.   On: 01/26/2014 07:39   Dg Chest Port 1 View  01/25/2014   CLINICAL DATA:  GI bleed, post TIPS.  Hypotension.  EXAM: PORTABLE CHEST - 1 VIEW  COMPARISON:  the previous day's study  FINDINGS: Heart size remains normal. Hazy opacity at the left lung base probably layering effusion. Right lung clear. Endotracheal tube, right IJ triple-lumen catheter, and right arm PICC stable in position. The Blakemore tube has been removed. Nasogastric tube loops in the stomach. Previous CABG. Visualized skeletal structures are unremarkable.  IMPRESSION: 1. Removal of Blakemore and placement of nasogastric tube as above. 2. Otherwise stable appearance since previous exam   Electronically Signed   By: Arne Cleveland M.D.   On: 01/25/2014 09:36   Dg Chest Port 1 View  01/24/2014   CLINICAL DATA:  Evaluation of to ribs  EXAM: PORTABLE CHEST - 1 VIEW  COMPARISON:  IR FLUORO GUIDE CV LINE*R* dated 01/24/2014; DG CHEST PORT 1VSAME DAY  dated 2014/02/20; DG CHEST 1V PORT dated 02-20-2014  FINDINGS: Endotracheal tube is 4.2 cm above the carinal. There is dual lumen right internal jugular central line with tip just proximal to the cavoatrial junction. There is a right PICC line with tip to the cavoatrial junction. NG tube is in unchanged position. There is increased hazy density at the left lung base suggesting pleural effusion and consolidation.  IMPRESSION: Lines and tubes as described. Increased hazy density left lung base.   Electronically Signed   By: Skipper Cliche M.D.   On:  01/24/2014 14:02   Dg Abd Portable 1v  01/24/2014   CLINICAL DATA:  NG tube placement  EXAM: PORTABLE ABDOMEN - 1 VIEW  COMPARISON:  None.  FINDINGS: An enteric tube is appreciated tracking along the expected course of the stomach. Air within nondilated loops of visualized bowel. Visualized osseous structures unremarkable.  IMPRESSION: Enteric tube curled in the expected course the stomach.   Electronically Signed   By: Margaree Mackintosh M.D.   On: 01/24/2014 18:35    Anti-infectives: Anti-infectives   Start     Dose/Rate Route Frequency Ordered Stop   01/24/14 0145  piperacillin-tazobactam (ZOSYN) IVPB 3.375 g     3.375 g 12.5 mL/hr over 240 Minutes Intravenous 3 times per day 01/24/14 0142     01/28/2014 1830  cefTRIAXone (ROCEPHIN) 1 g in dextrose 5 % 50 mL IVPB  Status:  Discontinued     1 g 100 mL/hr over 30 Minutes Intravenous Every 24 hours 02/01/2014 1829 01/24/14 0107      Assessment/Plan: s/p   TIPS 5/9 Intubated; agitated No further hematemesis H/H stable Plan per CCM   LOS: 4 days    Lavonia Drafts 01/26/2014

## 2014-01-26 NOTE — Progress Notes (Signed)
PULMONARY / CRITICAL CARE MEDICINE  Name: [name] MRN: [id number] DOB: 1950-12-02    ADMISSION DATE:  02/04/2014 CONSULTATION DATE:  02/05/2014  REFERRING MD :  Transfer from [facility] PRIMARY SERVICE: PCCM  CHIEF COMPLAINT:  Hemorrhagic shock  BRIEF PATIENT DESCRIPTION: 63 yo with alcoholic liver cirrhosis whop resented with hematemesis to [facility]. Intubated. EGD with grade III esophageal varices, unsuccessful banding. Briefly coded, resuscitated. Transferred to [facility] for emergent TIPS by IR.   LINES / TUBES: OETT 5/8 >>> [place] tube 5/8 >>> 5/9 Foley 5/8 >>> R PICC ??? >>> R IJ CVL 5/9 >>>5/11  CULTURES: 5/8  MRSA PCR >>> neg  ANTIBIOTICS: Zosyn 5/8  >>>    SIGNIFICANT EVENTS / STUDIES:  5/7 - Ammonia 154 5/8  EGD >>> esophageal varices, unsuccessful bending 5/9  IR >>> TIPS with decrease in portal pressure, paracentesis 01/25/14: Hypotensive overnight requiring vasopressors. Ammonia 80    SUBJECTIVE/OVERNIGHT/INTERVAL HX 01/26/14: On lactulose 30 tid but no BM. On prn fent and versed: occ follows command with ??? Rt side weakness per RN. RASS -1; improved. No overt bleeding.   VITAL SIGNS: Temp:  [99.5 F (37.5 C)-100.2 F (37.9 C)] 99.7 F (37.6 C) (05/11 0600) Pulse Rate:  [74-98] 98 (05/11 0600) Resp:  [18-21] 20 (05/11 0600) BP: (97-146)/(45-89) 139/67 mmHg (05/11 0600) SpO2:  [94 %-99 %] 95 % (05/11 0600) Arterial Line BP: (86-134)/(43-61) 113/49 mmHg (05/10 2300) FiO2 (%):  [50 %-70 %] 50 % (05/11 0755) Weight:  [101.6 kg (223 lb 15.8 oz)] 101.6 kg (223 lb 15.8 oz) (05/11 0456)  HEMODYNAMICS: CVP:  [7 mmHg-16 mmHg] 8 mmHg  VENTILATOR SETTINGS: Vent Mode:  [-] PRVC FiO2 (%):  [50 %-70 %] 50 % Set Rate:  [20 bmp] 20 bmp Vt Set:  [600 mL] 600 mL PEEP:  [5 cmH20-8 cmH20] 8 cmH20 Plateau Pressure:  [20 cmH20-26 cmH20] 21 cmH20  INTAKE / OUTPUT: Intake/Output     05/10 0701 - 05/11 0700 05/11 0701 - 05/12 0700   I.V. (mL/kg) 4094.6 (40.3)    NG/GT  480    IV Piggyback 150    Total Intake(mL/kg) 4724.6 (46.5)    Urine (mL/kg/hr) 1555 (0.6)    Emesis/NG output 1425 (0.6)    Other     Total Output 2980     Net +1744.6            PHYSICAL EXAMINATION: General:  Mechanically ventilated, synchronous Neuro:  Opens eyes to stimulation, but does not communicate. Agitated when stimulated. Moves all 4s equally HEENT:  PERRL, OETT / OGT Cardiovascular:  RRR, no m/r/g Lungs:  Bilateral diminished air entry, few rhonchi Abdomen:  Soft, nontender, bowel sounds diminished Musculoskeletal:  Trace edema Skin:  Intact  LABS:  PULMONARY  Recent Labs Lab 01/27/2014 1618 02/04/2014 1900 01/24/14 1827 01/26/14 0412  PHART 7.138* 7.346* 7.414 7.390  PCO2ART 48.1* 33.8* 24.5* 32.0*  PO2ART 145.0* 82.0 50.0* 108.0*  HCO3 15.6* 18.5* 15.7* 19.3*  TCO2 15.5 20 16 20   O2SAT 97.8 96.0 86.0 98.0    CBC  Recent Labs Lab 01/20/2014 0442 02/09/2014 1835  01/25/14 0710 01/25/14 1120 01/26/14 0031  HGB 7.8* 11.5*  < > 9.0* 9.0* 9.0*  HCT 22.5* 34.0*  < > 27.0* 27.6* 28.1*  WBC 15.1* 22.4*  --   --   --  15.4*  PLT 130* 89*  --   --   --  92*  < > = values in this interval not displayed.  COAGULATION  Recent Labs Lab 02/11/2014 1140 01/19/2014 1835  INR 1.42 1.60*    CARDIAC  No results found for this basename: TROPONINI,  in the last 168 hours No results found for this basename: PROBNP,  in the last 168 hours   CHEMISTRY  Recent Labs Lab 02/15/2014 1140 02/06/2014 0442 01/26/2014 1835 01/25/14 0748 01/26/14 0031  NA 137 145 146 149* 147  K 4.3 4.1 4.0 3.6* 3.4*  CL 100 113* 113* 120* 118*  CO2 23 20 20 19 19   GLUCOSE 148* 147* 209* 148* 126*  BUN 22 23 22 22 14   CREATININE 0.69 0.73 0.82 0.99 0.84  CALCIUM 8.9 7.8* 7.3* 7.2* 7.4*  MG  --   --   --   --  1.5  PHOS  --   --   --   --  1.6*   Estimated Creatinine Clearance: 100.5 ml/min (by C-G formula based on Cr of 0.84).   LIVER  Recent Labs Lab 02/08/2014 1140  02/04/2014 0442 01/19/2014 1835 01/25/14 0951  AST 36 25 51* 49*  ALT 16 11 19 21   ALKPHOS 113 72 81 63  BILITOT 1.3* 1.8* 3.5* 2.3*  PROT 7.0 4.9* 5.2* 4.5*  ALBUMIN 2.6* 2.0* 2.1* 1.8*  INR 1.42  --  1.60*  --      INFECTIOUS  Recent Labs Lab 01/25/14 1231 01/25/14 1705 01/26/14 0031  LATICACIDVEN 1.6 0.2* 1.4     ENDOCRINE CBG (last 3)   Recent Labs  01/25/14 2353 01/26/14 0345 01/26/14 0743  GLUCAP 132* 107* 119*         IMAGING x48h  Ir Fluoro Guide Cv Line Right  01/24/2014   CLINICAL DATA:  Acute esophageal variceal bleed, inadequately controlled endoscopically.  PROCEDURE: ULTRASOUND-GUIDED PARACENTESIS  TRANSJUGULAR INTRAHEPATIC PORTOSYSTEMIC SHUNT  RIGHT IJ CENTRAL VENOUS CATHETER PLACEMENT  COMPARISON:  Ultrasound 02/06/2014  Operators:  Leafy Half  TECHNIQUE: Survey ultrasound of the liver was performed and an appropriate skin entry site was determined. Site was marked, prepped with Betadine, draped in usual sterile fashion, infiltrated locally with 1% lidocaine. Under real-time ultrasound guidance, a 5 [place] multi side hole you sheath needle was placed into the right upper quadrant and peritoneal cavity to initiate paracentesis.  Under real-time ultrasound guidance, a 21 gauge micropuncture needle was advanced to in peripheral anterior right lobe branch of the a portal vein. A 018 guidewire advanced centrally easily. The needle was exchanged for 3 [place] dilator. Portal venography was performed. The portal vein was shown to be patent with antegrade hepatopetal flow. A guide wire was advanced through the percutaneous microdilator into the portal vein as a target.  The right neck prepped with chlorhexidine, draped in usual sterile fashion. The right IJ vein was accessed under real time ultrasound guidance with a 21-gauge micropuncture needle, exchanged over a 0.018 guide wire for the transitional dilator which allowed advancement of a Bentson wire into the IVC.  Over this, an angled 10-French vascular sheath was advanced. A 5-French angled angiographic catheter was coaxially advanced and used to catheterize the right hepatic vein. The angiographic catheter was exchanged for the [place] needle and sheath, which were used to create an intrahepatic tract from the right hepatic vein to the right portal vein under biplane fluoroscopic guidance using 2 passes. Contrast injection confirmed portal vein access at an appropriate location. A Bentson wire advanced easily into the portal vein. The needle was exchanged for a 4 French straight catheter, advanced into the main portal vein. Portal vein and  right atrial pressures were obtained. A portosystemic gradient of 83mmHg was recorded. The Catheter was exchanged for a 7 mm Conquest angioplasty balloon, used predilate the tract . The 10 mm x 60 mm Viatorr covered stent was then advanced across the track and deployed from the right portal vein to the right hepatic vein. The stent was dilated with a 10 mm balloon. Portal venography was performed and pressure measurements were obtained. A portasystemic gradient of 1 mmHg was recorded. There is no filling of varices from the portosplenic confluence. All visualized tributaries to the portal vein were selectively catheterized with a 5 [place] MPA catheter and selective venography performed pre and post deflation of the Blakemore balloon. No variceal filling identified.  There is diminished antegrade flow into peripheral portal venous branches. The percutaneous portal venous guidewire was removed. The right IJ sheath was exchanged over an Amplatz wire for a 20 cm trialysis catheter, positioned with its tip at the cavoatrial junction. Fluoroscopic spot image obtained. Catheter secured externally with 0 Prolene suture. The patient tolerated the procedure well.  FLUOROSCOPY TIME:  17 min 40 seconds  IMPRESSION: 1. Ultrasound-guided paracentesis removing 4 L clear yellow ascites. 2. Technically  successful TIPS creation, decreasing the portosystemic gradient from 19 to 1 mmHg. 3. Right IJ triple-lumen central venous catheter placement, OK for routine use.   Electronically Signed   By: Arne Cleveland M.D.   On: 01/24/2014 12:23   Ir Tips  01/24/2014   CLINICAL DATA:  Acute esophageal variceal bleed, inadequately controlled endoscopically.  PROCEDURE: ULTRASOUND-GUIDED PARACENTESIS  TRANSJUGULAR INTRAHEPATIC PORTOSYSTEMIC SHUNT  RIGHT IJ CENTRAL VENOUS CATHETER PLACEMENT  COMPARISON:  Ultrasound 02/06/2014  Operators:  Leafy Half  TECHNIQUE: Survey ultrasound of the liver was performed and an appropriate skin entry site was determined. Site was marked, prepped with Betadine, draped in usual sterile fashion, infiltrated locally with 1% lidocaine. Under real-time ultrasound guidance, a 5 [place] multi side hole you sheath needle was placed into the right upper quadrant and peritoneal cavity to initiate paracentesis.  Under real-time ultrasound guidance, a 21 gauge micropuncture needle was advanced to in peripheral anterior right lobe branch of the a portal vein. A 018 guidewire advanced centrally easily. The needle was exchanged for 3 [place] dilator. Portal venography was performed. The portal vein was shown to be patent with antegrade hepatopetal flow. A guide wire was advanced through the percutaneous microdilator into the portal vein as a target.  The right neck prepped with chlorhexidine, draped in usual sterile fashion. The right IJ vein was accessed under real time ultrasound guidance with a 21-gauge micropuncture needle, exchanged over a 0.018 guide wire for the transitional dilator which allowed advancement of a Bentson wire into the IVC. Over this, an angled 10-French vascular sheath was advanced. A 5-French angled angiographic catheter was coaxially advanced and used to catheterize the right hepatic vein. The angiographic catheter was exchanged for the [place] needle and sheath, which were  used to create an intrahepatic tract from the right hepatic vein to the right portal vein under biplane fluoroscopic guidance using 2 passes. Contrast injection confirmed portal vein access at an appropriate location. A Bentson wire advanced easily into the portal vein. The needle was exchanged for a 4 French straight catheter, advanced into the main portal vein. Portal vein and right atrial pressures were obtained. A portosystemic gradient of 34mmHg was recorded. The Catheter was exchanged for a 7 mm Conquest angioplasty balloon, used predilate the tract . The 10 mm x 60 mm Viatorr  covered stent was then advanced across the track and deployed from the right portal vein to the right hepatic vein. The stent was dilated with a 10 mm balloon. Portal venography was performed and pressure measurements were obtained. A portasystemic gradient of 1 mmHg was recorded. There is no filling of varices from the portosplenic confluence. All visualized tributaries to the portal vein were selectively catheterized with a 5 [place] MPA catheter and selective venography performed pre and post deflation of the Blakemore balloon. No variceal filling identified.  There is diminished antegrade flow into peripheral portal venous branches. The percutaneous portal venous guidewire was removed. The right IJ sheath was exchanged over an Amplatz wire for a 20 cm trialysis catheter, positioned with its tip at the cavoatrial junction. Fluoroscopic spot image obtained. Catheter secured externally with 0 Prolene suture. The patient tolerated the procedure well.  FLUOROSCOPY TIME:  17 min 40 seconds  IMPRESSION: 1. Ultrasound-guided paracentesis removing 4 L clear yellow ascites. 2. Technically successful TIPS creation, decreasing the portosystemic gradient from 19 to 1 mmHg. 3. Right IJ triple-lumen central venous catheter placement, OK for routine use.   Electronically Signed   By: Arne Cleveland M.D.   On: 01/24/2014 12:23   Dg Chest Port 1  View  01/26/2014   CLINICAL DATA:  Endotracheal tube  EXAM: PORTABLE CHEST - 1 VIEW  COMPARISON:  DG CHEST 1V PORT dated 01/25/2014; DG CHEST 1V PORT dated 01/24/2014; DG CHEST PORT 1VSAME DAY dated 01/25/2014  FINDINGS: Grossly unchanged cardiac silhouette and mediastinal contours post median sternotomy and CABG. Stable position of support apparatus. No pneumothorax. Pulmonary vasculature appears less distinct than present examination with cephalization of flow. Worsening bilateral mid and lower lung heterogeneous opacities. Trace bilateral effusions are suspected. Grossly unchanged bones.  IMPRESSION: 1.  Stable positioning of support apparatus.  No pneumothorax. 2. Worsening pulmonary edema and bilateral mid/lower lung opacities, atelectasis versus infiltrate.   Electronically Signed   By: Sandi Mariscal M.D.   On: 01/26/2014 07:39   Dg Chest Port 1 View  01/25/2014   CLINICAL DATA:  GI bleed, post TIPS.  Hypotension.  EXAM: PORTABLE CHEST - 1 VIEW  COMPARISON:  the previous day's study  FINDINGS: Heart size remains normal. Hazy opacity at the left lung base probably layering effusion. Right lung clear. Endotracheal tube, right IJ triple-lumen catheter, and right arm PICC stable in position. The Blakemore tube has been removed. Nasogastric tube loops in the stomach. Previous CABG. Visualized skeletal structures are unremarkable.  IMPRESSION: 1. Removal of Blakemore and placement of nasogastric tube as above. 2. Otherwise stable appearance since previous exam   Electronically Signed   By: Arne Cleveland M.D.   On: 01/25/2014 09:36   Dg Chest Port 1 View  01/24/2014   CLINICAL DATA:  Evaluation of to ribs  EXAM: PORTABLE CHEST - 1 VIEW  COMPARISON:  IR FLUORO GUIDE CV LINE*R* dated 01/24/2014; DG CHEST PORT 1VSAME DAY dated 02/15/2014; DG CHEST 1V PORT dated 02/12/2014  FINDINGS: Endotracheal tube is 4.2 cm above the carinal. There is dual lumen right internal jugular central line with tip just proximal to the cavoatrial  junction. There is a right PICC line with tip to the cavoatrial junction. NG tube is in unchanged position. There is increased hazy density at the left lung base suggesting pleural effusion and consolidation.  IMPRESSION: Lines and tubes as described. Increased hazy density left lung base.   Electronically Signed   By: Skipper Cliche M.D.   On:  01/24/2014 14:02   Dg Abd Portable 1v  01/24/2014   CLINICAL DATA:  NG tube placement  EXAM: PORTABLE ABDOMEN - 1 VIEW  COMPARISON:  None.  FINDINGS: An enteric tube is appreciated tracking along the expected course of the stomach. Air within nondilated loops of visualized bowel. Visualized osseous structures unremarkable.  IMPRESSION: Enteric tube curled in the expected course the stomach.   Electronically Signed   By: Margaree Mackintosh M.D.   On: 01/24/2014 18:35      ASSESSMENT / PLAN:  PULMONARY A: Acute respiratory failure Aspiration pneumonia / pneumonitis Difficult intubation    - 50% fio2, peep 8. Does not meet SBT criteria  P:   Goal pH>7.30, SpO2>92 Continuous mechanical support VAP bundle Daily SBT Trend ABG/CXR Albuterol / Atrovent  CARDIOVASCULAR A:  Hypovolemic / hemorrhagic / septic shock Cardiac arrest in setting of hemorrhagic shock, ROSC after 12 minutes of CPR   - on low dose levophed.  P:  Goal MAP > 65 Levophed gtt Trend lactate  RENAL A:   Hypomag and hypophos   P:   replete Trend BMP NS@50   CVP monitor  GASTROINTESTINAL A:   Alcoholic cirrhosis Variceal hemorrhage s/p unsuccessfully bending and successful TIPS  And large volume paracentesis 01/24/14 GI Px  - constipated  P:   Increase lactulose NPO Protonix daily Start TF Empiric albumin x 1  HEMATOLOGIC A:   Acute blood loss anemia Coagulopathy Thrombocytopenia VTE Px   P:  Trend CBC / INR SCD PRBC for hgb < 7gm% Monitor platelets (no heparin)   INFECTIOUS A:   Possible aspiration pneumonia At risk for SBP P:   Abx as  above  ENDOCRINE A:   Hyperglycemia No h/o DM Unknown adrenal function P:   Glycemic protocol Phase 1 Cortisol level  NEUROLOGIC A:   Alcohol abuse Acute encephalopathy  -  hepatic encephalopathy P:    Fentanyl / Versed PRN Thiamin / Folate Lactulose  Will increase 01/26/14   GLOBAL 01/26/14: wife updated   The patient is critically ill with multiple organ systems failure and requires high complexity decision making for assessment and support, frequent evaluation and titration of therapies, application of advanced monitoring technologies and extensive interpretation of multiple databases.   Critical Care Time devoted to patient care services described in this note is  35  Minutes.  Dr. Brand Males, M.D., [facility]C.P Pulmonary and Critical Care Medicine Staff Physician [facility] Pager: [phone], If no answer or between  15:00h - 7:00h: call [phone]  01/26/2014 10:19 AM

## 2014-01-26 NOTE — Progress Notes (Signed)
ANTIBIOTIC CONSULT NOTE - FOLLOW UP  Pharmacy Consult for Zosyn Indication: aspiration PNA  No Known Allergies  Patient Measurements: Height: 5\' 6"  (167.6 cm) Weight: 223 lb 15.8 oz (101.6 kg) IBW/kg (Calculated) : 63.8   Vital Signs: Temp: 99.7 F (37.6 C) (05/11 0600) Temp src: Core (Comment) (05/11 0600) BP: 139/67 mmHg (05/11 0600) Pulse Rate: 98 (05/11 0600) Intake/Output from previous day: 05/10 0701 - 05/11 0700 In: 4724.6 [I.V.:4094.6; NG/GT:480; IV Piggyback:150] Out: 2980 [Urine:1555; Emesis/NG output:1425] Intake/Output from this shift: Total I/O In: -  Out: 225 [Urine:225]  Labs:  Recent Labs  02/22/14 1835  01/25/14 0710 01/25/14 0748 01/25/14 1120 01/26/14 0031  WBC 22.4*  --   --   --   --  15.4*  HGB 11.5*  < > 9.0*  --  9.0* 9.0*  PLT 89*  --   --   --   --  92*  CREATININE 0.82  --   --  0.99  --  0.84  < > = values in this interval not displayed. Estimated Creatinine Clearance: 100.5 ml/min (by C-G formula based on Cr of 0.84). No results found for this basename: VANCOTROUGH, Corlis Leak, VANCORANDOM, GENTTROUGH, GENTPEAK, GENTRANDOM, TOBRATROUGH, TOBRAPEAK, TOBRARND, AMIKACINPEAK, AMIKACINTROU, AMIKACIN,  in the last Tom hours   Microbiology: Recent Results (from the past 720 hour(s))  MRSA PCR SCREENING     Status: None   Collection Time    2014/02/22  7:45 PM      Result Value Ref Range Status   MRSA by PCR NEGATIVE  NEGATIVE Final   Comment:            The GeneXpert MRSA Assay (FDA     approved for NASAL specimens     only), is one component of a     comprehensive MRSA colonization     surveillance program. It is not     intended to diagnose MRSA     infection nor to guide or     monitor treatment for     MRSA infections.    Anti-infectives   Start     Dose/Rate Route Frequency Ordered Stop   01/24/14 0145  piperacillin-tazobactam (ZOSYN) IVPB 3.375 g     3.375 g 12.5 mL/hr over 240 Minutes Intravenous 3 times per day 01/24/14 0142      02/10/2014 1830  cefTRIAXone (ROCEPHIN) 1 g in dextrose 5 % 50 mL IVPB  Status:  Discontinued     1 g 100 mL/hr over 30 Minutes Intravenous Every 24 hours 01/29/2014 1829 01/24/14 0107      Assessment: [name] who coded on 02/23/2023 and was started on Zosyn for possible aspiration pneumonia. SCr stable at 0.84 with CrCl ~164mL/min. WBC has dropped to 15.4, Tmax in the alt 24 hours 100.6. He is s/p TIPS procedure and remains intubated. No cultures. MRSA PCR negative.  Goal of Therapy:  Eradication of infection  Plan:  1. Continue Zosyn 3.375g IV q8h EI 2. Follow for renal function, clinical progression, LOT  Latondra Gebhart D. Vyctoria Dickman, PharmD, BCPS Clinical Pharmacist Pager: [phone] 01/26/2014 2:00 PM

## 2014-01-27 ENCOUNTER — Inpatient Hospital Stay (HOSPITAL_COMMUNITY): Payer: BC Managed Care – PPO

## 2014-01-27 ENCOUNTER — Encounter (HOSPITAL_COMMUNITY): Payer: Self-pay | Admitting: Internal Medicine

## 2014-01-27 LAB — CBC WITH DIFFERENTIAL/PLATELET
BASOS PCT: 0 % (ref 0–1)
Band Neutrophils: 2 % (ref 0–10)
Basophils Absolute: 0 10*3/uL (ref 0.0–0.1)
Blasts: 0 %
EOS ABS: 0.6 10*3/uL (ref 0.0–0.7)
Eosinophils Relative: 4 % (ref 0–5)
HCT: 26 % — ABNORMAL LOW (ref 39.0–52.0)
HEMOGLOBIN: 8.3 g/dL — AB (ref 13.0–17.0)
Lymphocytes Relative: 15 % (ref 12–46)
Lymphs Abs: 2.2 10*3/uL (ref 0.7–4.0)
MCH: 30 pg (ref 26.0–34.0)
MCHC: 31.9 g/dL (ref 30.0–36.0)
MCV: 93.9 fL (ref 78.0–100.0)
MYELOCYTES: 0 %
Metamyelocytes Relative: 1 %
Monocytes Absolute: 1 10*3/uL (ref 0.1–1.0)
Monocytes Relative: 7 % (ref 3–12)
NEUTROS ABS: 10.6 10*3/uL — AB (ref 1.7–7.7)
NEUTROS PCT: 71 % (ref 43–77)
Platelets: 91 10*3/uL — ABNORMAL LOW (ref 150–400)
Promyelocytes Absolute: 0 %
RBC: 2.77 MIL/uL — ABNORMAL LOW (ref 4.22–5.81)
RDW: 18.3 % — ABNORMAL HIGH (ref 11.5–15.5)
WBC: 14.4 10*3/uL — AB (ref 4.0–10.5)
nRBC: 0 /100 WBC

## 2014-01-27 LAB — GLUCOSE, CAPILLARY
GLUCOSE-CAPILLARY: 116 mg/dL — AB (ref 70–99)
GLUCOSE-CAPILLARY: 139 mg/dL — AB (ref 70–99)
Glucose-Capillary: 133 mg/dL — ABNORMAL HIGH (ref 70–99)
Glucose-Capillary: 129 mg/dL — ABNORMAL HIGH (ref 70–99)
Glucose-Capillary: 148 mg/dL — ABNORMAL HIGH (ref 70–99)
Glucose-Capillary: 137 mg/dL — ABNORMAL HIGH (ref 70–99)

## 2014-01-27 LAB — BASIC METABOLIC PANEL
BUN: 8 mg/dL (ref 6–23)
BUN: 10 mg/dL (ref 6–23)
CO2: 20 mEq/L (ref 19–32)
CO2: 19 mEq/L (ref 19–32)
CREATININE: 0.63 mg/dL (ref 0.50–1.35)
CREATININE: 0.74 mg/dL (ref 0.50–1.35)
Calcium: 7.2 mg/dL — ABNORMAL LOW (ref 8.4–10.5)
Calcium: 7.8 mg/dL — ABNORMAL LOW (ref 8.4–10.5)
Chloride: 117 mEq/L — ABNORMAL HIGH (ref 96–112)
Chloride: 117 mEq/L — ABNORMAL HIGH (ref 96–112)
GFR calc non Af Amer: >90 mL/min (ref >90)
GFR calc non Af Amer: >90 mL/min (ref >90)
GLUCOSE: 165 mg/dL — AB (ref 70–99)
GLUCOSE: 170 mg/dL — AB (ref 70–99)
POTASSIUM: 3.7 meq/L (ref 3.7–5.3)
Potassium: 2.9 mEq/L — CL (ref 3.7–5.3)
Sodium: 145 mEq/L (ref 137–147)
Sodium: 147 mEq/L (ref 137–147)

## 2014-01-27 LAB — MAGNESIUM: Magnesium: 1.7 mg/dL (ref 1.5–2.5)

## 2014-01-27 LAB — PHOSPHORUS
PHOSPHORUS: 1.9 mg/dL — AB (ref 2.3–4.6)
PHOSPHORUS: 1.9 mg/dL — AB (ref 2.3–4.6)
Phosphorus: 1.7 mg/dL — ABNORMAL LOW (ref 2.3–4.6)

## 2014-01-27 LAB — TROPONIN I: Troponin I: <0.3 ng/mL (ref <0.30)

## 2014-01-27 LAB — AMMONIA: Ammonia: 81 umol/L — ABNORMAL HIGH (ref 11–60)

## 2014-01-27 LAB — PROTIME-INR
INR: 1.74 — ABNORMAL HIGH (ref 0.00–1.49)
Prothrombin Time: 19.8 seconds — ABNORMAL HIGH (ref 11.6–15.2)

## 2014-01-27 MED ORDER — DEXMEDETOMIDINE HCL IN NACL 200 MCG/50ML IV SOLN
0.0000 ug/kg/h | INTRAVENOUS | Status: DC
  Administered 2014-01-27: 0.4 ug/kg/h via INTRAVENOUS
  Administered 2014-01-27 (×2): 0.6 ug/kg/h via INTRAVENOUS
  Administered 2014-01-27: 0.5 ug/kg/h via INTRAVENOUS
  Administered 2014-01-28: 0.8 ug/kg/h via INTRAVENOUS
  Administered 2014-01-28 (×2): 0.7 ug/kg/h via INTRAVENOUS
  Administered 2014-01-28: 0.9 ug/kg/h via INTRAVENOUS
  Administered 2014-01-28: 0.8 ug/kg/h via INTRAVENOUS
  Administered 2014-01-28 (×2): 0.4 ug/kg/h via INTRAVENOUS
  Administered 2014-01-29: 0.7 ug/kg/h via INTRAVENOUS
  Administered 2014-01-29 (×2): 1.2 ug/kg/h via INTRAVENOUS
  Filled 2014-01-27: qty 100
  Filled 2014-01-27 (×6): qty 50
  Filled 2014-01-27: qty 150
  Filled 2014-01-27: qty 50
  Filled 2014-01-27: qty 100
  Filled 2014-01-27 (×2): qty 50

## 2014-01-27 MED ORDER — POTASSIUM CHLORIDE 20 MEQ/15ML (10%) PO LIQD
40.0000 meq | ORAL | Status: AC
  Administered 2014-01-27 (×2): 40 meq
  Filled 2014-01-27 (×3): qty 30

## 2014-01-27 MED ORDER — RIFAXIMIN 550 MG PO TABS
550.0000 mg | ORAL_TABLET | Freq: Two times a day (BID) | ORAL | Status: DC
  Administered 2014-01-27 – 2014-01-29 (×6): 550 mg via ORAL
  Filled 2014-01-27 (×8): qty 1

## 2014-01-27 MED ORDER — SODIUM PHOSPHATE 3 MMOLE/ML IV SOLN
20.0000 mmol | Freq: Once | INTRAVENOUS | Status: AC
  Administered 2014-01-27: 20 mmol via INTRAVENOUS
  Filled 2014-01-27: qty 6.67

## 2014-01-27 MED ORDER — VITAMIN K1 10 MG/ML IJ SOLN
5.0000 mg | Freq: Once | INTRAMUSCULAR | Status: AC
  Administered 2014-01-27: 5 mg via SUBCUTANEOUS
  Filled 2014-01-27: qty 0.5

## 2014-01-27 MED ORDER — MAGNESIUM SULFATE 40 MG/ML IJ SOLN
2.0000 g | Freq: Once | INTRAMUSCULAR | Status: AC
  Administered 2014-01-27: 2 g via INTRAVENOUS
  Filled 2014-01-27: qty 50

## 2014-01-27 MED ORDER — VITAMIN K1 1 MG/0.5ML IJ SOLN: 5.0000 mg | Freq: Once | INTRAMUSCULAR | Status: DC

## 2014-01-27 MED FILL — Medication: Qty: 1 | Status: AC

## 2014-01-27 NOTE — Progress Notes (Signed)
CRITICAL VALUE ALERT  Critical value received:  K 2.9  Date of notification:  01/27/2014  Time of notification:  5:17 AM  Critical value read back:yes  Nurse who received alert:  Clydene Fake  MD notified (1st page):  Deterding MD  Time of first page:  5:18 AM    MD notified (2nd page):  Time of second page:  Responding MD:  Deterding MD  Time MD responded:  5:18 AM

## 2014-01-27 NOTE — Progress Notes (Addendum)
PULMONARY / CRITICAL CARE MEDICINE  Name: [name] MRN: [id number] DOB: 1951-04-06    ADMISSION DATE:  02/13/2014 CONSULTATION DATE:  02/01/2014  REFERRING MD :  Transfer from [facility] PRIMARY SERVICE: PCCM  CHIEF COMPLAINT:  Hemorrhagic shock  BRIEF PATIENT DESCRIPTION: 63 yo with alcoholic liver cirrhosis whop resented with hematemesis to [facility]. Intubated. EGD with grade III esophageal varices, unsuccessful banding. Briefly coded, resuscitated. Transferred to [facility] for emergent TIPS by IR.   LINES / TUBES: OETT 5/8 >>> [place] tube 5/8 >>> 5/9 Foley 5/8 >>> R PICC ??? >>> R IJ CVL 5/9 >>>5/11  CULTURES: 5/8  MRSA PCR >>> neg  ANTIBIOTICS: Zosyn 5/8  >>>  Rifaximin (hep enceph) 5/12 >>  SIGNIFICANT EVENTS / STUDIES:  5/7 - Ammonia 154 5/8  EGD >>> esophageal varices, unsuccessful bending 5/9  IR >>> TIPS with decrease in portal pressure, paracentesis 01/25/14: Hypotensive overnight requiring vasopressors. Ammonia 80 01/26/14: On lactulose 30 tid but no BM. On prn fent and versed: occ follows command with ??? Rt side weakness per RN. RASS -1; improved. No overt bleeding.     SUBJECTIVE/OVERNIGHT/INTERVAL HX  01/27/14: On small dose of levophed. No overt bleeding. On 40% fio2. On precedex gtt + prn fent/versed -> gets agitated. Now having bowel movements after lactulose increased. Ammonia 81. On tube feeds  VITAL SIGNS: Temp:  [97.4 F (36.3 C)-101 F (38.3 C)] 100.2 F (37.9 C) (05/12 1200) Pulse Rate:  [77-121] 80 (05/12 1200) Resp:  [16-25] 20 (05/12 1200) BP: (86-135)/(41-66) 86/47 mmHg (05/12 1200) SpO2:  [92 %-99 %] 97 % (05/12 1200) Arterial Line BP: (74-125)/(43-91) 95/87 mmHg (05/12 0900) FiO2 (%):  [40 %-50 %] 40 % (05/12 1200) Weight:  [102.6 kg (226 lb 3.1 oz)] 102.6 kg (226 lb 3.1 oz) (05/12 0426)  HEMODYNAMICS: CVP:  [8 mmHg-13 mmHg] 12 mmHg  VENTILATOR SETTINGS: Vent Mode:  [-] PRVC FiO2 (%):  [40 %-50 %] 40 % Set Rate:  [20 bmp] 20 bmp Vt  Set:  [600 mL] 600 mL PEEP:  [5 cmH20] 5 cmH20 Pressure Support:  [5 cmH20] 5 cmH20 Plateau Pressure:  [20 cmH20-21 cmH20] 21 cmH20  INTAKE / OUTPUT: Intake/Output     05/11 0701 - 05/12 0700 05/12 0701 - 05/13 0700   I.V. (mL/kg) 2197 (21.4) 211.1 (2.1)   NG/GT 543.3 65   IV Piggyback 858    Total Intake(mL/kg) 3598.3 (35.1) 276.1 (2.7)   Urine (mL/kg/hr) 1275 (0.5) 100 (0.2)   Emesis/NG output 400 (0.2)    Total Output 1675 100   Net +1923.3 +176.1          PHYSICAL EXAMINATION: General:  Mechanically ventilated, synchronous Neuro:  Opens eyes to stimulation, but does not communicate. Agitated when stimulated. Moves all 4s equally HEENT:  PERRL, OETT / OGT Cardiovascular:  RRR, no m/r/g Lungs:  Bilateral diminished air entry, few rhonchi Abdomen:  Soft, nontender, bowel sounds diminished Musculoskeletal:  Trace edema Skin:  Intact  LABS:  PULMONARY  Recent Labs Lab 01/29/2014 1618 01/17/2014 1900 01/24/14 1827 01/26/14 0412  PHART 7.138* 7.346* 7.414 7.390  PCO2ART 48.1* 33.8* 24.5* 32.0*  PO2ART 145.0* 82.0 50.0* 108.0*  HCO3 15.6* 18.5* 15.7* 19.3*  TCO2 15.5 20 16 20   O2SAT 97.8 96.0 86.0 98.0    CBC  Recent Labs Lab 01/18/2014 1835  01/25/14 1120 01/26/14 0031 01/27/14 0400  HGB 11.5*  < > 9.0* 9.0* 8.3*  HCT 34.0*  < > 27.6* 28.1* 26.0*  WBC 22.4*  --   --  15.4* 14.4*  PLT 89*  --   --  92* 91*  < > = values in this interval not displayed.  COAGULATION  Recent Labs Lab 02/15/2014 1140 02/04/2014 1835 01/27/14 0400  INR 1.42 1.60* 1.74*    CARDIAC    Recent Labs Lab 01/27/14 0400  TROPONINI <0.30   No results found for this basename: PROBNP,  in the last 168 hours   CHEMISTRY  Recent Labs Lab 01/25/2014 0442 02/06/2014 1835 01/25/14 0748 01/26/14 0031 01/26/14 1137 01/26/14 2337 01/27/14 0400  NA 145 146 149* 147  --   --  145  K 4.1 4.0 3.6* 3.4*  --   --  2.9*  CL 113* 113* 120* 118*  --   --  117*  CO2 20 20 19 19   --   --   20  GLUCOSE 147* 209* 148* 126*  --   --  165*  BUN 23 22 22 14   --   --  8  CREATININE 0.73 0.82 0.99 0.84  --   --  0.63  CALCIUM 7.8* 7.3* 7.2* 7.4*  --   --  7.2*  MG  --   --   --  1.5  --   --  1.7  PHOS  --   --   --  1.6* 2.0* 1.9* 1.9*   Estimated Creatinine Clearance: 106 ml/min (by C-G formula based on Cr of 0.63).   LIVER  Recent Labs Lab 02/13/2014 1140 01/27/2014 0442 02/10/2014 1835 01/25/14 0951 01/27/14 0400  AST 36 25 51* 49*  --   ALT 16 11 19 21   --   ALKPHOS 113 72 81 63  --   BILITOT 1.3* 1.8* 3.5* 2.3*  --   PROT 7.0 4.9* 5.2* 4.5*  --   ALBUMIN 2.6* 2.0* 2.1* 1.8*  --   INR 1.42  --  1.60*  --  1.74*     INFECTIOUS  Recent Labs Lab 01/25/14 1231 01/25/14 1705 01/26/14 0031  LATICACIDVEN 1.6 0.2* 1.4     ENDOCRINE CBG (last 3)   Recent Labs  01/27/14 0409 01/27/14 0809 01/27/14 1129  GLUCAP 139* 133* 129*         IMAGING x48h  Dg Chest Port 1 View  01/27/2014   CLINICAL DATA:  Hypoxia  EXAM: PORTABLE CHEST - 1 VIEW  COMPARISON:  Jan 26, 2014  FINDINGS: Endotracheal tube tip is 3.7 cm above the carina. Nasogastric tube tip and side port are below the diaphragm. Central catheter tip is at the cavoatrial junction. No pneumothorax.  There is moderate interstitial edema. Heart is upper normal in size with mild pulmonary venous hypertension. There is patchy airspace consolidation in the right upper lobe, slightly less than 1 day prior. No new opacity. No adenopathy.  IMPRESSION: Congestive heart failure with persistent edema. Marginally less airspace consolidation in right upper lobe compared to 1 day prior. Tube and catheter positions are as described without pneumothorax.   Electronically Signed   By: Lowella Grip M.D.   On: 01/27/2014 07:05   Dg Chest Port 1 View  01/26/2014   CLINICAL DATA:  Endotracheal tube  EXAM: PORTABLE CHEST - 1 VIEW  COMPARISON:  DG CHEST 1V PORT dated 01/25/2014; DG CHEST 1V PORT dated 01/24/2014; DG CHEST PORT  1VSAME DAY dated 02/10/2014  FINDINGS: Grossly unchanged cardiac silhouette and mediastinal contours post median sternotomy and CABG. Stable position of support apparatus. No pneumothorax. Pulmonary vasculature appears less distinct than present examination with cephalization  of flow. Worsening bilateral mid and lower lung heterogeneous opacities. Trace bilateral effusions are suspected. Grossly unchanged bones.  IMPRESSION: 1.  Stable positioning of support apparatus.  No pneumothorax. 2. Worsening pulmonary edema and bilateral mid/lower lung opacities, atelectasis versus infiltrate.   Electronically Signed   By: Sandi Mariscal M.D.   On: 01/26/2014 07:39      ASSESSMENT / PLAN:  PULMONARY A: Acute respiratory failure Aspiration pneumonia / pneumonitis Difficult intubation    - 40% fio2, peep 5. Does not meet SBT criteria due to encephalopathy  P:   Goal pH>7.30, SpO2>92 Continuous mechanical support VAP bundle Daily SBT Trend ABG/CXR Albuterol / Atrovent  CARDIOVASCULAR A:  Hypovolemic / hemorrhagic / septic shock Cardiac arrest in setting of hemorrhagic shock, ROSC after 12 minutes of CPR   - on low dose levophed. But also on precedex P:  Goal MAP > 65 Levophed gtt   RENAL A:   Hypomag and hypophos and hypokalemia   P:   Replete (done by elink) Trend BMP NS@50   CVP monitor  GASTROINTESTINAL A:   Alcoholic cirrhosis  - Variceal hemorrhage s/p unsuccessfully bending and successful TIPS  And large volume paracentesis 01/24/14, s.p 1 dose albumin 01/26/14    - making stool now after increasing lactulose 01/26/14  P:   Contninue  Lactulose Add Rifaximin NPO Protonix daily Continue TF   HEMATOLOGIC A:   Acute blood loss anemia Coagulopathy Thrombocytopenia  - acute, due to liver disease (baseline > 125)  VTE Px   P:  Trend CBC / INR SCD PRBC for hgb < 7gm% Monitor platelets (no heparin)   INFECTIOUS A:   Possible aspiration pneumonia At risk for SBP P:    Abx as above  ENDOCRINE A:   Hyperglycemia No h/o DM Unknown adrenal function P:   Glycemic protocol Phase 1 Cortisol level  NEUROLOGIC A:   Alcohol abuse Acute encephalopathy  -  hepatic encephalopathy P:    Fentanyl / Versed PRN Thiamin / Folate Lactulose  To continue Add Rifaximin CT head wo contrast (due to cardiac arrest, wife requesting)   GLOBAL 01/26/14: wife updated 51/2/15: wife and cousin updated.    The patient is critically ill with multiple organ systems failure and requires high complexity decision making for assessment and support, frequent evaluation and titration of therapies, application of advanced monitoring technologies and extensive interpretation of multiple databases.   Critical Care Time devoted to patient care services described in this note is  35  Minutes.  Dr. Brand Males, M.D., [facility].C.P Pulmonary and Critical Care Medicine Staff Physician [place] [facility] Pager: [phone], If no answer or between  15:00h - 7:00h: call [phone]  01/27/2014 12:24 PM

## 2014-01-28 ENCOUNTER — Inpatient Hospital Stay (HOSPITAL_COMMUNITY): Payer: BC Managed Care – PPO

## 2014-01-28 ENCOUNTER — Encounter (HOSPITAL_COMMUNITY): Payer: Self-pay | Admitting: Gastroenterology

## 2014-01-28 LAB — BASIC METABOLIC PANEL
BUN: 12 mg/dL (ref 6–23)
CALCIUM: 8.2 mg/dL — AB (ref 8.4–10.5)
CO2: 19 meq/L (ref 19–32)
CREATININE: 0.7 mg/dL (ref 0.50–1.35)
Chloride: 117 mEq/L — ABNORMAL HIGH (ref 96–112)
GFR calc Af Amer: >90 mL/min (ref >90)
GFR calc non Af Amer: >90 mL/min (ref >90)
GLUCOSE: 149 mg/dL — AB (ref 70–99)
Potassium: 3.4 mEq/L — ABNORMAL LOW (ref 3.7–5.3)
Sodium: 148 mEq/L — ABNORMAL HIGH (ref 137–147)

## 2014-01-28 LAB — CBC WITH DIFFERENTIAL/PLATELET
Basophils Absolute: 0 10*3/uL (ref 0.0–0.1)
Basophils Relative: 0 % (ref 0–1)
EOS ABS: 0.4 10*3/uL (ref 0.0–0.7)
EOS PCT: 2 % (ref 0–5)
HEMATOCRIT: 28.5 % — AB (ref 39.0–52.0)
HEMOGLOBIN: 9 g/dL — AB (ref 13.0–17.0)
Lymphocytes Relative: 14 % (ref 12–46)
Lymphs Abs: 2.5 10*3/uL (ref 0.7–4.0)
MCH: 29.3 pg (ref 26.0–34.0)
MCHC: 31.6 g/dL (ref 30.0–36.0)
MCV: 92.8 fL (ref 78.0–100.0)
Monocytes Absolute: 3.4 10*3/uL — ABNORMAL HIGH (ref 0.1–1.0)
Monocytes Relative: 19 % — ABNORMAL HIGH (ref 3–12)
NEUTROS ABS: 11.6 10*3/uL — AB (ref 1.7–7.7)
Neutrophils Relative %: 65 % (ref 43–77)
Platelets: 123 10*3/uL — ABNORMAL LOW (ref 150–400)
RBC: 3.07 MIL/uL — AB (ref 4.22–5.81)
RDW: 18.4 % — ABNORMAL HIGH (ref 11.5–15.5)
WBC: 17.9 10*3/uL — ABNORMAL HIGH (ref 4.0–10.5)

## 2014-01-28 LAB — PHOSPHORUS
Phosphorus: 1.8 mg/dL — ABNORMAL LOW (ref 2.3–4.6)
Phosphorus: 3.2 mg/dL (ref 2.3–4.6)

## 2014-01-28 LAB — PRO B NATRIURETIC PEPTIDE: PRO B NATRI PEPTIDE: 2100 pg/mL — AB (ref 0–125)

## 2014-01-28 LAB — GLUCOSE, CAPILLARY
GLUCOSE-CAPILLARY: 133 mg/dL — AB (ref 70–99)
GLUCOSE-CAPILLARY: 146 mg/dL — AB (ref 70–99)
GLUCOSE-CAPILLARY: 146 mg/dL — AB (ref 70–99)
GLUCOSE-CAPILLARY: 152 mg/dL — AB (ref 70–99)
Glucose-Capillary: 161 mg/dL — ABNORMAL HIGH (ref 70–99)
Glucose-Capillary: 157 mg/dL — ABNORMAL HIGH (ref 70–99)

## 2014-01-28 LAB — MAGNESIUM: Magnesium: 2 mg/dL (ref 1.5–2.5)

## 2014-01-28 LAB — PROCALCITONIN: Procalcitonin: 0.13 ng/mL

## 2014-01-28 MED ORDER — POTASSIUM PHOSPHATES 15 MMOLE/5ML IV SOLN
30.0000 mmol | Freq: Once | INTRAVENOUS | Status: AC
  Administered 2014-01-28: 30 mmol via INTRAVENOUS
  Filled 2014-01-28: qty 10

## 2014-01-28 MED ORDER — VITAMIN B-1 100 MG PO TABS
100.0000 mg | ORAL_TABLET | Freq: Every day | ORAL | Status: DC
  Administered 2014-01-29: 100 mg
  Filled 2014-01-28 (×2): qty 1

## 2014-01-28 MED ORDER — PANTOPRAZOLE SODIUM 40 MG PO PACK
40.0000 mg | PACK | Freq: Every day | ORAL | Status: DC
  Administered 2014-01-28: 40 mg
  Filled 2014-01-28 (×2): qty 20

## 2014-01-28 MED ORDER — FOLIC ACID 1 MG PO TABS
1.0000 mg | ORAL_TABLET | Freq: Every day | ORAL | Status: DC
  Administered 2014-01-29: 1 mg
  Filled 2014-01-28 (×2): qty 1

## 2014-01-28 MED ORDER — ALBUMIN HUMAN 25 % IV SOLN
12.5000 g | Freq: Four times a day (QID) | INTRAVENOUS | Status: AC
  Administered 2014-01-28 – 2014-01-29 (×4): 12.5 g via INTRAVENOUS
  Filled 2014-01-28 (×4): qty 50

## 2014-01-28 NOTE — Progress Notes (Signed)
[place] Progress Note Patient Name: [name] DOB: September 28, 1950 MRN: [id number]  Date of Service  01/28/2014   HPI/Events of Note   Diarrhea on lactulose   eICU Interventions  Place rectal tube   Intervention Category Intermediate Interventions: Other:  Collene Gobble 01/28/2014, 5:09 PM

## 2014-01-28 NOTE — Progress Notes (Signed)
RN requested pt to be placed back on vent full support d/t pt w/ agitation, and increased WOB.

## 2014-01-28 NOTE — Progress Notes (Signed)
[place] Progress Note Patient Name: VAUN HYNDMAN DOB: [birth year] MRN: [id number]  Date of Service  01/28/2014   HPI/Events of Note  Hypokalemia and hypophos   eICU Interventions  Potassium and phos replaced   Intervention Category Intermediate Interventions: Electrolyte abnormality - evaluation and management  Gu[place]upe Sabin Deterding 01/28/2014, 5:47 AM

## 2014-01-28 NOTE — Progress Notes (Signed)
RN called to room after RT found patient with emesis covering the front of his gown and face. RN from day shift reported emesis x 2 during the day. Tube feeding stopped and patient suctioned. Elink notified of situation. Patient proceeded to vomit 2 more times before low intermittent suction was applied to OG tube and removed 250 ml of bright yellow bile. Patient hemodynamically stable throughout with no acute signs of distress. Will continue to monitor patient.

## 2014-01-28 NOTE — Progress Notes (Signed)
PULMONARY / CRITICAL CARE MEDICINE  Name: [name] MRN: [id number] DOB: 1950/11/22    ADMISSION DATE:  02/14/2014 CONSULTATION DATE:  01/22/2014 LOS 6 days   REFERRING MD :  Transfer from [facility] PRIMARY SERVICE: PCCM  CHIEF COMPLAINT:  Hemorrhagic shock  BRIEF PATIENT DESCRIPTION: 63 yo with alcoholic liver cirrhosis whop resented with hematemesis to [facility]. Intubated. EGD with grade III esophageal varices, unsuccessful banding. Briefly coded, resuscitated. Transferred to [facility] for emergent TIPS by IR.   LINES / TUBES: OETT 5/8 >>> [place] tube 5/8 >>> 5/9 Foley 5/8 >>> R PICC ??? >>> R IJ CVL 5/9 >>>5/11  CULTURES: 5/8  MRSA PCR >>> neg  ANTIBIOTICS: Zosyn 5/8  >>>  Rifaximin (hep enceph) 5/12 >>  SIGNIFICANT EVENTS / STUDIES:  5/7 - Ammonia 154 5/8  EGD >>> esophageal varices, unsuccessful bending 5/9  IR >>> TIPS with decrease in portal pressure, paracentesis 01/25/14: Hypotensive overnight requiring vasopressors. Ammonia 80 01/26/14: On lactulose 30 tid but no BM. On prn fent and versed: occ follows command with ??? Rt side weakness per RN. RASS -1; improved. No overt bleeding.  1 x 25gm albumin  01/27/14: On small dose of levophed. No overt bleeding. On 40% fio2. On precedex gtt + prn fent/versed -> gets agitated. Now having bowel movements after lactulose increased. Ammonia 81. On tube feeds   SUBJECTIVE/OVERNIGHT/INTERVAL HX  01/28/14: Stll on 16mcg levophed. On 40% fi02. On precdex -> wua gets agitated (apparently followed some commands last night) but moved all 4s. Good bowel movement  VITAL SIGNS: Temp:  [99.8 F (37.7 C)-101.3 F (38.5 C)] 99.8 F (37.7 C) (05/13 1000) Pulse Rate:  [72-90] 73 (05/13 1000) Resp:  [20-30] 29 (05/13 1000) BP: (86-115)/(45-59) 104/53 mmHg (05/13 1000) SpO2:  [91 %-97 %] 96 % (05/13 1000) FiO2 (%):  [40 %] 40 % (05/13 0800) Weight:  [104.9 kg (231 lb 4.2 oz)] 104.9 kg (231 lb 4.2 oz) (05/13 0441)  HEMODYNAMICS:     VENTILATOR SETTINGS: Vent Mode:  [-] CPAP FiO2 (%):  [40 %] 40 % Set Rate:  [20 bmp] 20 bmp Vt Set:  [600 mL] 600 mL PEEP:  [5 cmH20] 5 cmH20 Pressure Support:  [12 cmH20] 12 cmH20 Plateau Pressure:  [22 cmH20] 22 cmH20  INTAKE / OUTPUT: Intake/Output     05/12 0701 - 05/13 0700 05/13 0701 - 05/14 0700   I.V. (mL/kg) 1565 (14.9) 142.1 (1.4)   NG/GT 975 160   IV Piggyback 150 85   Total Intake(mL/kg) 2690 (25.6) 387.1 (3.7)   Urine (mL/kg/hr) 790 (0.3) 75 (0.2)   Emesis/NG output     Total Output 790 75   Net +1900 +312.1          PHYSICAL EXAMINATION: General:  Mechanically ventilated, synchronous Neuro:  Opens eyes to stimulation, but does not communicate. Agitated when stimulated. Moves all 4s equally HEENT:  PERRL, OETT / OGT Cardiovascular:  RRR, no m/r/g Lungs:  Bilateral diminished air entry, few rhonchi Abdomen:  Soft, nontender, bowel sounds diminished. Seems to have more ascites +. Non specific RUQ tenderness + Musculoskeletal:  Trace edema Skin:  Intact  LABS:  PULMONARY  Recent Labs Lab 02/03/2014 1618 02/11/2014 1900 01/24/14 1827 01/26/14 0412  PHART 7.138* 7.346* 7.414 7.390  PCO2ART 48.1* 33.8* 24.5* 32.0*  PO2ART 145.0* 82.0 50.0* 108.0*  HCO3 15.6* 18.5* 15.7* 19.3*  TCO2 15.5 20 16 20   O2SAT 97.8 96.0 86.0 98.0    CBC  Recent Labs Lab 01/26/14 0031 01/27/14  0400 01/28/14 0450  HGB 9.0* 8.3* 9.0*  HCT 28.1* 26.0* 28.5*  WBC 15.4* 14.4* 17.9*  PLT 92* 91* 123*    COAGULATION  Recent Labs Lab Feb 13, 2014 1140 02/11/2014 1835 01/27/14 0400  INR 1.42 1.60* 1.74*    CARDIAC    Recent Labs Lab 01/27/14 0400  TROPONINI <0.30    Recent Labs Lab 01/28/14 0450  PROBNP 2100.0*     CHEMISTRY  Recent Labs Lab 01/25/14 0748  01/26/14 0031 01/26/14 1137 01/26/14 2337 01/27/14 0400 01/27/14 1700 01/27/14 1950 01/28/14 0450  NA 149*  --  147  --   --  145  --  147 148*  K 3.6*  --  3.4*  --   --  2.9*  --  3.7 3.4*  CL  120*  --  118*  --   --  117*  --  117* 117*  CO2 19  --  19  --   --  20  --  19 19  GLUCOSE 148*  --  126*  --   --  165*  --  170* 149*  BUN 22  --  14  --   --  8  --  10 12  CREATININE 0.99  --  0.84  --   --  0.63  --  0.74 0.70  CALCIUM 7.2*  --  7.4*  --   --  7.2*  --  7.8* 8.2*  MG  --   --  1.5  --   --  1.7  --   --  2.0  PHOS  --   < > 1.6* 2.0* 1.9* 1.9* 1.7*  --  1.8*  < > = values in this interval not displayed. Estimated Creatinine Clearance: 107.2 ml/min (by C-G formula based on Cr of 0.7).   LIVER  Recent Labs Lab 13-Feb-2014 1140 01/29/2014 0442 02/12/2014 1835 01/25/14 0951 01/27/14 0400  AST 36 25 51* 49*  --   ALT 16 11 19 21   --   ALKPHOS 113 72 81 63  --   BILITOT 1.3* 1.8* 3.5* 2.3*  --   PROT 7.0 4.9* 5.2* 4.5*  --   ALBUMIN 2.6* 2.0* 2.1* 1.8*  --   INR 1.42  --  1.60*  --  1.74*     INFECTIOUS  Recent Labs Lab 01/25/14 1231 01/25/14 1705 01/26/14 0031  LATICACIDVEN 1.6 0.2* 1.4     ENDOCRINE CBG (last 3)   Recent Labs  01/28/14 0006 01/28/14 0417 01/28/14 0816  GLUCAP 146* 152* 146*         IMAGING x48h  Dg Chest Port 1 View  01/28/2014   CLINICAL DATA:  Hypoxia  EXAM: PORTABLE CHEST - 1 VIEW  COMPARISON:  Jan 27, 2014  FINDINGS: Endotracheal tube tip is 3.3 cm above the carina. Central catheter tip is at cavoatrial junction. Nasogastric tube tip and side-port in the stomach. No pneumothorax.  The there is persistent interstitial edema. There has been slight interval clearing in the right upper lobe compared to 1 day prior. No new opacity. Heart is upper normal in size. The pulmonary vascularity is within normal limits.  IMPRESSION: Tube and catheter positions as described without pneumothorax. Persistent interstitial edema. Slight interval clearing right upper lobe. No new opacity.   Electronically Signed   By: Lowella Grip M.D.   On: 01/28/2014 07:51   Dg Chest Port 1 View  01/27/2014   CLINICAL DATA:  Hypoxia  EXAM:  PORTABLE CHEST - 1  VIEW  COMPARISON:  Jan 26, 2014  FINDINGS: Endotracheal tube tip is 3.7 cm above the carina. Nasogastric tube tip and side port are below the diaphragm. Central catheter tip is at the cavoatrial junction. No pneumothorax.  There is moderate interstitial edema. Heart is upper normal in size with mild pulmonary venous hypertension. There is patchy airspace consolidation in the right upper lobe, slightly less than 1 day prior. No new opacity. No adenopathy.  IMPRESSION: Congestive heart failure with persistent edema. Marginally less airspace consolidation in right upper lobe compared to 1 day prior. Tube and catheter positions are as described without pneumothorax.   Electronically Signed   By: Lowella Grip M.D.   On: 01/27/2014 07:05   Ct Portable Head W/o Cm  01/27/2014   CLINICAL DATA:  Post cardiac arrest.  EXAM: CT HEAD WITHOUT CONTRAST  TECHNIQUE: Contiguous axial images were obtained from the base of the skull through the vertex without intravenous contrast.  COMPARISON:  None.  FINDINGS: No intracranial hemorrhage.  Small vessel disease type changes.  Taking into account motion and portable technique, no CT evidence of large acute infarct or diffuse anoxia.  No hydrocephalus.  No intracranial mass lesion noted on this unenhanced exam.  Partial opacification mastoid air cells bilaterally. No definitive mass at the level of drainage of the eustachian tube (posterior superior nasopharynx) although incompletely assessed.  IMPRESSION: No intracranial hemorrhage.  Small vessel disease type changes.  Taking into account motion and portable technique, no CT evidence of large acute infarct or diffuse anoxia.  Partial opacification mastoid air cells bilaterally.  Please see above.   Electronically Signed   By: Chauncey Cruel M.D.   On: 01/27/2014 14:39      ASSESSMENT / PLAN:  PULMONARY A: Acute respiratory failure Aspiration pneumonia / pneumonitis Difficult intubation    - 40%  fio2, peep 5. Does not meet SBT criteria due to encephalopathy and pressor need  P:   Continuous mechanical support VAP bundle Daily SBT Trend ABG/CXR Albuterol / Atrovent  CARDIOVASCULAR A:  Hypovolemic / hemorrhagic / septic shock Cardiac arrest in setting of hemorrhagic shock, ROSC after 12 minutes of CPR   - on low dose levophed. But also on precedex P:  Goal MAP > 65 Levophed gtt Precedex to continue Try albumin for some increase in oncotic pressure and see if we can get him off levophed   RENAL A:   Hypomag and hypophos and hypokalemia   P:   Replete (done by elink) Trend BMP NS@50   CVP monitor  GASTROINTESTINAL A:   Alcoholic cirrhosis  - Variceal hemorrhage s/p unsuccessfully bending and successful TIPS  And large volume paracentesis 01/24/14, s.p 1 dose albumin 01/26/14    - making stool now after increasing lactulose 01/26/14 . ? Worsening ascites 01/28/14  P:   Contninue  Lactulose and  Rifaximin NPO Protonix daily Continue TF RX albumin x  4 doses 01/28/14  HEMATOLOGIC A:   Acute blood loss anemia Coagulopathy Thrombocytopenia  - acute, possibly due to liver disease (baseline > 125)  VTE Px    - counts holding  P:  Trend CBC / INR SCD PRBC for hgb < 7gm% Monitor platelets (no heparin)   INFECTIOUS A:   Possible aspiration pneumonia At risk for SBP P:   Abx as above  ENDOCRINE A:   Hyperglycemia No h/o DM Unknown adrenal function P:   Glycemic protocol Phase 1 Cortisol level  NEUROLOGIC A:   Alcohol abuse Acute encephalopathy  -  hepatic and ICU encephalopathy persists P:    Fentanyl / Versed PRN Thiamin / Folate Lactulose  To continue Rifaximin to continue    GLOBAL 01/26/14: wife updated 01/28/14: wife and cousin updated.  Explained he is heading into chronic critical illness. Advised long haul outcome likely. Will address LTAC/trach issues in few days depending on course   The patient is critically ill with multiple  organ systems failure and requires high complexity decision making for assessment and support, frequent evaluation and titration of therapies, application of advanced monitoring technologies and extensive interpretation of multiple databases.   Critical Care Time devoted to patient care services described in this note is  35  Minutes.  Dr. Brand Males, M.D., [facility].C.P Pulmonary and Critical Care Medicine Staff Physician [place] [facility] Pager: [phone], If no answer or between  15:00h - 7:00h: call [phone]  01/28/2014 10:11 AM

## 2014-01-29 ENCOUNTER — Inpatient Hospital Stay (HOSPITAL_COMMUNITY): Payer: BC Managed Care – PPO

## 2014-01-29 ENCOUNTER — Encounter (HOSPITAL_COMMUNITY): Admission: EM | Disposition: E | Payer: Self-pay | Source: Home / Self Care | Attending: Pulmonary Disease

## 2014-01-29 ENCOUNTER — Encounter (HOSPITAL_COMMUNITY): Payer: Self-pay | Admitting: Gastroenterology

## 2014-01-29 DIAGNOSIS — K920 Hematemesis: Secondary | ICD-10-CM

## 2014-01-29 DIAGNOSIS — J690 Pneumonitis due to inhalation of food and vomit: Secondary | ICD-10-CM

## 2014-01-29 DIAGNOSIS — K219 Gastro-esophageal reflux disease without esophagitis: Secondary | ICD-10-CM

## 2014-01-29 DIAGNOSIS — D649 Anemia, unspecified: Secondary | ICD-10-CM

## 2014-01-29 DIAGNOSIS — J9600 Acute respiratory failure, unspecified whether with hypoxia or hypercapnia: Secondary | ICD-10-CM

## 2014-01-29 HISTORY — PX: ESOPHAGOGASTRODUODENOSCOPY (EGD) WITH PROPOFOL: SHX5813

## 2014-01-29 LAB — POCT I-STAT 3, ART BLOOD GAS (G3+)
Acid-base deficit: 9 mmol/L — ABNORMAL HIGH (ref 0.0–2.0)
BICARBONATE: 14.7 meq/L — AB (ref 20.0–24.0)
O2 Saturation: 96 %
PCO2 ART: 26.1 mmHg — AB (ref 35.0–45.0)
Patient temperature: 102.2
TCO2: 15 mmol/L (ref 0–100)
pH, Arterial: 7.369 (ref 7.350–7.450)
pO2, Arterial: 94 mmHg (ref 80.0–100.0)

## 2014-01-29 LAB — CK TOTAL AND CKMB (NOT AT ARMC)
CK, MB: <1 ng/mL (ref 0.3–4.0)
Total CK: 107 U/L (ref 7–232)
Total CK: 93 U/L (ref 7–232)

## 2014-01-29 LAB — BASIC METABOLIC PANEL
BUN: 21 mg/dL (ref 6–23)
BUN: 24 mg/dL — ABNORMAL HIGH (ref 6–23)
CHLORIDE: 110 meq/L (ref 96–112)
CO2: 17 meq/L — AB (ref 19–32)
CO2: 16 mEq/L — ABNORMAL LOW (ref 19–32)
Calcium: 8.9 mg/dL (ref 8.4–10.5)
Calcium: 8.5 mg/dL (ref 8.4–10.5)
Chloride: 115 mEq/L — ABNORMAL HIGH (ref 96–112)
Creatinine, Ser: 0.82 mg/dL (ref 0.50–1.35)
Creatinine, Ser: 1.35 mg/dL (ref 0.50–1.35)
GFR calc Af Amer: >90 mL/min (ref >90)
GFR calc non Af Amer: >90 mL/min (ref >90)
GFR, EST AFRICAN AMERICAN: 63 mL/min — AB (ref >90)
GFR, EST NON AFRICAN AMERICAN: 54 mL/min — AB (ref >90)
Glucose, Bld: 133 mg/dL — ABNORMAL HIGH (ref 70–99)
Glucose, Bld: 147 mg/dL — ABNORMAL HIGH (ref 70–99)
POTASSIUM: 4 meq/L (ref 3.7–5.3)
POTASSIUM: 4 meq/L (ref 3.7–5.3)
SODIUM: 151 meq/L — AB (ref 137–147)
SODIUM: 147 meq/L (ref 137–147)

## 2014-01-29 LAB — CBC WITH DIFFERENTIAL/PLATELET
BASOS PCT: 0 % (ref 0–1)
Basophils Absolute: 0 10*3/uL (ref 0.0–0.1)
Eosinophils Absolute: 0 10*3/uL (ref 0.0–0.7)
Eosinophils Relative: 0 % (ref 0–5)
HEMATOCRIT: 33.3 % — AB (ref 39.0–52.0)
HEMOGLOBIN: 10.7 g/dL — AB (ref 13.0–17.0)
LYMPHS PCT: 7 % — AB (ref 12–46)
Lymphs Abs: 1.2 10*3/uL (ref 0.7–4.0)
MCH: 29.9 pg (ref 26.0–34.0)
MCHC: 32.1 g/dL (ref 30.0–36.0)
MCV: 93 fL (ref 78.0–100.0)
Monocytes Absolute: 1.9 10*3/uL — ABNORMAL HIGH (ref 0.1–1.0)
Monocytes Relative: 11 % (ref 3–12)
NEUTROS ABS: 14.3 10*3/uL — AB (ref 1.7–7.7)
Neutrophils Relative %: 82 % — ABNORMAL HIGH (ref 43–77)
Platelets: ADEQUATE 10*3/uL (ref 150–400)
RBC: 3.58 MIL/uL — ABNORMAL LOW (ref 4.22–5.81)
RDW: 18.5 % — ABNORMAL HIGH (ref 11.5–15.5)
WBC Morphology: INCREASED
WBC: 17.4 10*3/uL — ABNORMAL HIGH (ref 4.0–10.5)

## 2014-01-29 LAB — CBC
HCT: 31.6 % — ABNORMAL LOW (ref 39.0–52.0)
HCT: 35.4 % — ABNORMAL LOW (ref 39.0–52.0)
HCT: 35.9 % — ABNORMAL LOW (ref 39.0–52.0)
HCT: 34.9 % — ABNORMAL LOW (ref 39.0–52.0)
HEMOGLOBIN: 11.4 g/dL — AB (ref 13.0–17.0)
HEMOGLOBIN: 11.1 g/dL — AB (ref 13.0–17.0)
Hemoglobin: 10.1 g/dL — ABNORMAL LOW (ref 13.0–17.0)
Hemoglobin: 11.4 g/dL — ABNORMAL LOW (ref 13.0–17.0)
MCH: 29.5 pg (ref 26.0–34.0)
MCH: 29.1 pg (ref 26.0–34.0)
MCH: 29.2 pg (ref 26.0–34.0)
MCH: 29 pg (ref 26.0–34.0)
MCHC: 32 g/dL (ref 30.0–36.0)
MCHC: 32.2 g/dL (ref 30.0–36.0)
MCHC: 31.8 g/dL (ref 30.0–36.0)
MCHC: 31.8 g/dL (ref 30.0–36.0)
MCV: 92.4 fL (ref 78.0–100.0)
MCV: 90.3 fL (ref 78.0–100.0)
MCV: 92.1 fL (ref 78.0–100.0)
MCV: 91.1 fL (ref 78.0–100.0)
PLATELETS: 123 10*3/uL — AB (ref 150–400)
PLATELETS: 99 10*3/uL — AB (ref 150–400)
Platelets: 98 10*3/uL — ABNORMAL LOW (ref 150–400)
Platelets: 103 10*3/uL — ABNORMAL LOW (ref 150–400)
RBC: 3.42 MIL/uL — AB (ref 4.22–5.81)
RBC: 3.92 MIL/uL — ABNORMAL LOW (ref 4.22–5.81)
RBC: 3.9 MIL/uL — AB (ref 4.22–5.81)
RBC: 3.83 MIL/uL — ABNORMAL LOW (ref 4.22–5.81)
RDW: 18.6 % — ABNORMAL HIGH (ref 11.5–15.5)
RDW: 18.1 % — AB (ref 11.5–15.5)
RDW: 18.7 % — ABNORMAL HIGH (ref 11.5–15.5)
RDW: 19 % — ABNORMAL HIGH (ref 11.5–15.5)
WBC: 10.9 10*3/uL — AB (ref 4.0–10.5)
WBC: 15.8 10*3/uL — ABNORMAL HIGH (ref 4.0–10.5)
WBC: 18.3 10*3/uL — AB (ref 4.0–10.5)
WBC: 22.3 10*3/uL — ABNORMAL HIGH (ref 4.0–10.5)

## 2014-01-29 LAB — AMMONIA: Ammonia: 97 umol/L — ABNORMAL HIGH (ref 11–60)

## 2014-01-29 LAB — GLUCOSE, CAPILLARY
GLUCOSE-CAPILLARY: 99 mg/dL (ref 70–99)
GLUCOSE-CAPILLARY: 86 mg/dL (ref 70–99)
GLUCOSE-CAPILLARY: 74 mg/dL (ref 70–99)
GLUCOSE-CAPILLARY: 61 mg/dL — AB (ref 70–99)
GLUCOSE-CAPILLARY: 104 mg/dL — AB (ref 70–99)
Glucose-Capillary: 139 mg/dL — ABNORMAL HIGH (ref 70–99)
Glucose-Capillary: 121 mg/dL — ABNORMAL HIGH (ref 70–99)
Glucose-Capillary: 66 mg/dL — ABNORMAL LOW (ref 70–99)

## 2014-01-29 LAB — PROTIME-INR
INR: 1.58 — ABNORMAL HIGH (ref 0.00–1.49)
INR: 1.72 — AB (ref 0.00–1.49)
PROTHROMBIN TIME: 18.4 s — AB (ref 11.6–15.2)
Prothrombin Time: 19.7 seconds — ABNORMAL HIGH (ref 11.6–15.2)

## 2014-01-29 LAB — TROPONIN I: TROPONIN I: 0.32 ng/mL — AB (ref <0.30)

## 2014-01-29 LAB — HEPATIC FUNCTION PANEL
ALBUMIN: 2.5 g/dL — AB (ref 3.5–5.2)
ALK PHOS: 73 U/L (ref 39–117)
ALT: 22 U/L (ref 0–53)
ALT: 20 U/L (ref 0–53)
AST: 53 U/L — ABNORMAL HIGH (ref 0–37)
AST: 47 U/L — AB (ref 0–37)
Albumin: 2.5 g/dL — ABNORMAL LOW (ref 3.5–5.2)
Alkaline Phosphatase: 68 U/L (ref 39–117)
BILIRUBIN INDIRECT: 3.2 mg/dL — AB (ref 0.3–0.9)
Bilirubin, Direct: 2.8 mg/dL — ABNORMAL HIGH (ref 0.0–0.3)
Bilirubin, Direct: 3.3 mg/dL — ABNORMAL HIGH (ref 0.0–0.3)
Indirect Bilirubin: 3.1 mg/dL — ABNORMAL HIGH (ref 0.3–0.9)
TOTAL PROTEIN: 5.4 g/dL — AB (ref 6.0–8.3)
Total Bilirubin: 6 mg/dL — ABNORMAL HIGH (ref 0.3–1.2)
Total Bilirubin: 6.4 mg/dL — ABNORMAL HIGH (ref 0.3–1.2)
Total Protein: 5.6 g/dL — ABNORMAL LOW (ref 6.0–8.3)

## 2014-01-29 LAB — MAGNESIUM: MAGNESIUM: 1.9 mg/dL (ref 1.5–2.5)

## 2014-01-29 LAB — PREPARE RBC (CROSSMATCH)

## 2014-01-29 LAB — PROCALCITONIN: PROCALCITONIN: 0.21 ng/mL

## 2014-01-29 LAB — LACTIC ACID, PLASMA
LACTIC ACID, VENOUS: 5.2 mmol/L — AB (ref 0.5–2.2)
LACTIC ACID, VENOUS: 7.5 mmol/L — AB (ref 0.5–2.2)

## 2014-01-29 LAB — PHOSPHORUS: Phosphorus: 4.4 mg/dL (ref 2.3–4.6)

## 2014-01-29 SURGERY — ESOPHAGOGASTRODUODENOSCOPY (EGD) WITH PROPOFOL
Anesthesia: Moderate Sedation

## 2014-01-29 MED ORDER — SODIUM CHLORIDE 0.9 % IV SOLN
8.0000 mg/h | INTRAVENOUS | Status: DC
  Administered 2014-01-29 – 2014-01-30 (×3): 8 mg/h via INTRAVENOUS
  Filled 2014-01-29 (×5): qty 80

## 2014-01-29 MED ORDER — SODIUM CHLORIDE 0.9 % IV BOLUS (SEPSIS): 500.0000 mL | INTRAVENOUS | Status: DC | PRN

## 2014-01-29 MED ORDER — PANTOPRAZOLE SODIUM 40 MG IV SOLR: 40.0000 mg | Freq: Two times a day (BID) | INTRAVENOUS | Status: DC

## 2014-01-29 MED ORDER — FENTANYL CITRATE 0.05 MG/ML IJ SOLN
100.0000 ug | Freq: Once | INTRAMUSCULAR | Status: AC
  Administered 2014-01-29: 100 ug via INTRAVENOUS

## 2014-01-29 MED ORDER — MIDAZOLAM HCL 2 MG/2ML IJ SOLN
2.0000 mg | Freq: Once | INTRAMUSCULAR | Status: AC
  Administered 2014-01-29: 2 mg via INTRAVENOUS

## 2014-01-29 MED ORDER — MIDAZOLAM HCL 5 MG/ML IJ SOLN
INTRAMUSCULAR | Status: AC
  Filled 2014-01-29: qty 1

## 2014-01-29 MED ORDER — MIDAZOLAM HCL 2 MG/2ML IJ SOLN
INTRAMUSCULAR | Status: AC
  Filled 2014-01-29: qty 2

## 2014-01-29 MED ORDER — FENTANYL CITRATE 0.05 MG/ML IJ SOLN
INTRAMUSCULAR | Status: AC
  Filled 2014-01-29: qty 2

## 2014-01-29 MED ORDER — SODIUM CHLORIDE 0.9 % IV SOLN
25.0000 ug/h | INTRAVENOUS | Status: DC
  Administered 2014-01-29 – 2014-01-30 (×2): 25 ug/h via INTRAVENOUS
  Filled 2014-01-29 (×3): qty 1

## 2014-01-29 MED ORDER — SODIUM CHLORIDE 0.9 % IV SOLN
80.0000 mg | Freq: Once | INTRAVENOUS | Status: AC
  Administered 2014-01-29: 80 mg via INTRAVENOUS
  Filled 2014-01-29: qty 80

## 2014-01-29 MED ORDER — FENTANYL BOLUS VIA INFUSION
50.0000 ug | INTRAVENOUS | Status: DC | PRN
  Filled 2014-01-29: qty 100

## 2014-01-29 MED ORDER — DEXTROSE 50 % IV SOLN
25.0000 mL | Freq: Once | INTRAVENOUS | Status: AC | PRN
  Administered 2014-01-29: 25 mL via INTRAVENOUS
  Filled 2014-01-29: qty 50

## 2014-01-29 MED ORDER — MAGNESIUM SULFATE IN D5W 10-5 MG/ML-% IV SOLN
1.0000 g | Freq: Once | INTRAVENOUS | Status: AC
  Administered 2014-01-29: 1 g via INTRAVENOUS
  Filled 2014-01-29: qty 100

## 2014-01-29 MED ORDER — NOREPINEPHRINE BITARTRATE 1 MG/ML IV SOLN
2.0000 ug/min | INTRAVENOUS | Status: DC
  Administered 2014-01-29: 24 ug/min via INTRAVENOUS
  Administered 2014-01-29: 50 ug/min via INTRAVENOUS
  Administered 2014-01-30: 38 ug/min via INTRAVENOUS
  Filled 2014-01-29 (×4): qty 16

## 2014-01-29 MED ORDER — SODIUM CHLORIDE 0.9 % IV SOLN
0.0000 ug/h | INTRAVENOUS | Status: DC
  Administered 2014-01-29: 100 ug/h via INTRAVENOUS
  Administered 2014-01-30: 75 ug/h via INTRAVENOUS
  Filled 2014-01-29 (×3): qty 50

## 2014-01-29 MED ORDER — MIDAZOLAM BOLUS VIA INFUSION
1.0000 mg | INTRAVENOUS | Status: DC | PRN
  Filled 2014-01-29: qty 2

## 2014-01-29 MED ORDER — FENTANYL CITRATE 0.05 MG/ML IJ SOLN: 50.0000 ug | Freq: Once | INTRAMUSCULAR | Status: DC

## 2014-01-29 MED ORDER — SODIUM CHLORIDE 0.9 % IV SOLN: INTRAVENOUS | Status: DC

## 2014-01-29 MED ORDER — OCTREOTIDE LOAD VIA INFUSION
50.0000 ug | Freq: Once | INTRAVENOUS | Status: AC
  Administered 2014-01-29: 50 ug via INTRAVENOUS
  Filled 2014-01-29: qty 25

## 2014-01-29 MED ORDER — ONDANSETRON HCL 4 MG/2ML IJ SOLN
4.0000 mg | INTRAMUSCULAR | Status: DC | PRN
  Administered 2014-01-29: 4 mg via INTRAVENOUS
  Filled 2014-01-29: qty 2

## 2014-01-29 MED ORDER — SODIUM CHLORIDE 0.9 % IV SOLN
0.0000 mg/h | INTRAVENOUS | Status: DC
  Administered 2014-01-29: 3 mg/h via INTRAVENOUS
  Administered 2014-01-29: 2 mg/h via INTRAVENOUS
  Filled 2014-01-29 (×2): qty 10

## 2014-01-29 NOTE — Progress Notes (Signed)
Patient laid down flat to be repositioned by this RN and NT. Patient began having bright red emesis from his mouth, subglottic tube and OGT in the amount of approximately 57ml plus a large amount that spilled over onto the bed sheets and patient's body. Patient respirations 35-50 per minute, HR elevated to 150's and patient extremely hypertensive at 203/68. Elink notified, PRN zofran ordered along with stat labs. Levophed started at 0430 due to BP of 62/35 with MAP of 41. Will continue to assess and monitor patient.

## 2014-01-29 NOTE — Interval H&P Note (Signed)
History and Physical Interval Note:  01/18/2014 1:06 PM  [name]  has presented today for surgery, with the diagnosis of hematemesis, recent tips and variceal bleeding treated with banding.  The various methods of treatment have been discussed with the patient and family. After consideration of risks, benefits and other options for treatment, the patient has consented to  Procedure(s) with comments: ESOPHAGOGASTRODUODENOSCOPY (EGD) WITH PROPOFOL (N/A) - bedside as a surgical intervention .  The patient's history has been reviewed, patient examined, no change in status, stable for surgery.  I have reviewed the patient's chart and labs.  Questions were answered to the patient's satisfaction.     Milus Banister

## 2014-01-29 NOTE — Procedures (Signed)
Staff note  Supervised procedure. Real time 2D ultrasound used for vein site selection, patency assessment, and needle entry. A  / A record of image was made but could not be submitted for filing due to malfunction of printing device   Dr. Brand Males, M.D., [facility].C.P Pulmonary and Critical Care Medicine Staff Physician [place] [facility] Pager: [phone], If no answer or between  15:00h - 7:00h: call [phone]  February 27, 2014 11:44 AM

## 2014-01-29 NOTE — Procedures (Signed)
Central Venous Catheter Insertion Procedure Note [name] [id number] 1951/02/07  Procedure: Insertion of Central Venous Catheter Indications: Assessment of intravascular volume and Drug and/or fluid administration  Procedure Details Consent: Unable to obtain consent because of emergent medical necessity. Time Out: Verified patient identification, verified procedure, site/side was marked, verified correct patient position, special equipment/implants available, medications/allergies/relevent history reviewed, required imaging and test results available.  Performed  Maximum sterile technique was used including antiseptics, cap, gloves, gown, hand hygiene, mask and sheet. Skin prep: Chlorhexidine; local anesthetic administered A antimicrobial bonded/coated triple lumen catheter was placed in the left internal jugular vein using the Seldinger technique.  Evaluation Blood flow good Complications: No apparent complications Patient did tolerate procedure well. Chest X-ray ordered to verify placement.  CXR: pending.  Performed under direct MD supervision.  Performed using ultrasound guidance.  Wire visualized in vessel under ultrasound.    Marijean Heath, NP Feb 05, 2014, 9:48 AM

## 2014-01-29 NOTE — Progress Notes (Signed)
[place] Progress Note Patient Name: [name] DOB: 06-26-1951 MRN: [id number]  Date of Service  02/09/2014   HPI/Events of Note  Hematemesis   eICU Interventions  CBC, PT STAT. Ensure that pt is typed and crossmatched. PRN ondansetron ordered   Intervention Category Major Interventions: Hemorrhage - evaluation and management  Wilhelmina Mcardle 01/20/2014, 4:02 AM

## 2014-01-29 NOTE — Progress Notes (Signed)
NUTRITION FOLLOW UP  Intervention:   - If able to tolerate, restart Vital High Protein at 25 ml/hr on day 1 per PEPuP Protocol. on day 2, increase to goal rate of 65 ml/h (1560 ml per day) to provide 1560 kcals (24 kcals/kg ideal weight), 137 gm protein, 1304 ml free water daily. - If unable to tolerate TF, consider alternative means of nutrition.   Nutrition Dx:   Inadequate oral intake related to inability to eat as evidenced by NPO status, ongoing.  Goal:   Enteral nutrition to provide 60-70% of estimated calorie needs (22-25 kcals/kg ideal body weight) and 100% of estimated protein needs, based on ASPEN guidelines for permissive underfeeding in critically ill obese individuals, not progressing.  Monitor:   TF tolerance/adequacy, weights, labs, vent status  Assessment:   Patient is a 63 y.o. male with a past medical history that includes CAD status post CABG 2009, hyperlipidemia, EtOH, presented to the emergency department on 5/7 with the chief complaint of bloody emesis x3 since 1 AM that morning.  Patient remains intubated on ventilator support.  MV: 19.6 L/min Max temp (39C)  Patient was doing well on TF, but found 5/13 with bloody emesis followed by circulatory shock and lactic acidosis. Tube feedings stopped and patient suctioned. Patient scheduled for EGD today.   Height: Ht Readings from Last 1 Encounters:  02/09/2014 5\' 6"  (1.676 m)    Weight Status:   Wt Readings from Last 1 Encounters:  02/08/2014 237 lb 7 oz (107.7 kg)    Re-estimated needs:  Kcal: 2135 kcal Protein: >/= 130 g Fluid: 2.1 L  Skin: Puncture wounds on neck  Diet Order: NPO   Intake/Output Summary (Last 24 hours) at 02/13/2014 1354 Last data filed at 02/05/2014 1300  Gross per 24 hour  Intake 4042.34 ml  Output   2090 ml  Net 1952.34 ml    Last BM: 5/13 x 3   Labs:   Recent Labs Lab 01/27/14 0400  01/28/14 0450 01/28/14 1700 02/07/2014 0400 01/26/2014 0845  NA 145  < > 148*  --  151* 147   K 2.9*  < > 3.4*  --  4.0 4.0  CL 117*  < > 117*  --  115* 110  CO2 20  < > 19  --  17* 16*  BUN 8  < > 12  --  21 24*  CREATININE 0.63  < > 0.70  --  0.82 1.35  CALCIUM 7.2*  < > 8.2*  --  8.9 8.5  MG 1.7  --  2.0  --  1.9  --   PHOS 1.9*  < > 1.8* 3.2 4.4  --   GLUCOSE 165*  < > 149*  --  133* 147*  < > = values in this interval not displayed.  CBG (last 3)   Recent Labs  02/13/2014 0353 01/21/2014 0827 01/27/2014 1155  GLUCAP 121* 99 86    Scheduled Meds: . antiseptic oral rinse  15 mL Mouth Rinse QID  . chlorhexidine  15 mL Mouth Rinse BID  . feeding supplement (VITAL HIGH PROTEIN)  1,000 mL Per Tube Q24H  . fentaNYL  50 mcg Intravenous Once  . folic acid  1 mg Per Tube Daily  . insulin aspart  2-6 Units Subcutaneous 6 times per day  . lactulose  30 g Oral Q6H  . midazolam      . [START ON 02/01/2014] pantoprazole (PROTONIX) IV  40 mg Intravenous Q12H  . piperacillin-tazobactam (ZOSYN)  IV  3.375 g Intravenous 3 times per day  . rifaximin  550 mg Oral Q12H  . sodium chloride  10-40 mL Intracatheter Q12H  . thiamine  100 mg Per Tube Daily    Continuous Infusions: . sodium chloride 50 mL/hr at 01/25/2014 0700  . sodium chloride    . fentaNYL infusion INTRAVENOUS 200 mcg/hr (01/16/2014 1218)  . midazolam (VERSED) infusion 4 mg/hr (02/08/2014 1218)  . norepinephrine (LEVOPHED) Adult infusion 40 mcg/min (01/19/2014 1218)  . octreotide (SANDOSTATIN) infusion 25 mcg/hr (02/06/2014 1012)  . pantoprozole (PROTONIX) infusion 8 mg/hr (01/19/2014 1006)    Larey Seat, RD, LDN Pager #: [phone] After-Hours Pager #: [phone]

## 2014-01-29 NOTE — Progress Notes (Signed)
Unable to insert A-line x 2.  Dr Chase Caller notified, and asked if he wants RT to attempt to insert a-line again.  Per MD, d/c aline order.

## 2014-01-29 NOTE — Progress Notes (Signed)
[facility] Progress Note Patient Name: [name] DOB: 12/18/1950 MRN: [id number]  Date of Service  Feb 21, 2014   HPI/Events of Note  Now hypotensive   eICU Interventions  Transfuse one unit PRBCs while awaiting AM lab results Resume CVP monitoring Resume NE to maintain BP until volume resuscitated   Intervention Category Major Interventions: Hypotension - evaluation and management  Wilhelmina Mcardle 2014/02/21, 4:39 AM
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
## 2014-01-29 NOTE — Op Note (Signed)
[facility] [place], [id number]   ENDOSCOPY PROCEDURE REPORT  PATIENT: Tom, Berger  MR#: #[id number] BIRTHDATE: 02/05/1955 , 25  yrs. old GENDER: Male ENDOSCOPIST: Milus Banister, MD PROCEDURE DATE:  02/01/2014 PROCEDURE:  EGD, diagnostic ASA CLASS:     Class IV INDICATIONS:  recurrent GI bleeding; EGD twice last week Dr. Oneida Alar and then eventual TIPS procedure for bleeding esopahgeal varices. Clinically bleeding stopped after TIPS until last night when bleeding resumed. TIPS evaluation with bedside doppler just prior to this exam showed normal, expeded flow through the TIPS MEDICATIONS: General endotracheal anesthesia (GETA) TOPICAL ANESTHETIC: none  DESCRIPTION OF PROCEDURE: After the risks benefits and alternatives of the procedure were thoroughly explained, informed consent was obtained.  The Pentax Gastroscope Peds [id number] endoscope was introduced through the mouth and advanced to the second portion of the duodenum. Without limitations.  The instrument was slowly withdrawn as the mucosa was fully examined.   The esophageal varices were quite small now.  The sites of recent ligation were not actively oozing.  There was blood clot in proximal stomach.  There was reddish blood thinly coating stomach. I could not find any site of active bleeding in the UGI tract. There was red blood in oropharynx which is probably washing up from GI tract.  Retroflexed views revealed no abnormalities.     The scope was then withdrawn from the patient and the procedure completed. COMPLICATIONS: There were no complications.  ENDOSCOPIC IMPRESSION: There was blood in UGI tract and in this mouth. I could not find any site of active bleeding in GI tract. The esophageal varices are now very small and there was no oozing from the band ligation sites. Duodenum appeared normal.  Unclear source of his recurrent bleeding.  RECOMMENDATIONS: Continue  supportive care in ICU including octreotide drip and PPI drip for now.   eSigned:  Milus Banister, MD 02/10/2014 2:00 PM

## 2014-01-29 NOTE — Progress Notes (Signed)
PULMONARY / CRITICAL CARE MEDICINE  Name: [name] MRN: [id number] DOB: April 21, 1951   PCP Earney Mallet, MD    ADMISSION DATE:  02/10/2014 CONSULTATION DATE:  01/18/2014 LOS 7 days   REFERRING MD :  Transfer from [facility] PRIMARY SERVICE: PCCM  CHIEF COMPLAINT:  Hemorrhagic shock  BRIEF PATIENT DESCRIPTION: 63 yo with alcoholic liver cirrhosis whop resented with hematemesis to [facility]. Intubated. EGD with grade III esophageal varices, unsuccessful banding. Briefly coded, resuscitated. Transferred to [facility] for emergent TIPS by IR.   LINES / TUBES: OETT 5/8 >>> [place] tube 5/8 >>> 5/9 Foley 5/8 >>> R PICC ??? >>> R IJ CVL 5/9 >>>5/11  CULTURES: 5/8  MRSA PCR >>> neg  ANTIBIOTICS: Zosyn 5/8  >>>  Rifaximin (hep enceph) 5/12 >>  SIGNIFICANT EVENTS / STUDIES:  5/7 - Ammonia 154 5/8  EGD >>> esophageal varices, unsuccessful bending 5/9  IR >>> TIPS with decrease in portal pressure, paracentesis 01/25/14: Hypotensive overnight requiring vasopressors. Ammonia 80 01/26/14: On lactulose 30 tid but no BM. On prn fent and versed: occ follows command with ??? Rt side weakness per RN. RASS -1; improved. No overt bleeding.  1 x 25gm albumin  01/27/14: On small dose of levophed. No overt bleeding. On 40% fio2. On precedex gtt + prn fent/versed -> gets agitated. Now having bowel movements after lactulose increased. Ammonia 81. On tube feeds   01/28/14: Stll on 11mg levophed. On 40% fi02. On precdex -> wua gets agitated (apparently followed some commands last night) but moved all 4s. Good bowel movement   SUBJECTIVE/OVERNIGHT/INTERVAL HX  01/21/2014: Massive recurrence of hemoptysis few hours ago followed by circulatory shock and lactic acidosis at 5.2. Abdomen more distended.   VITAL SIGNS: Temp:  [95.8 F (35.4 C)-102.1 F (38.9 C)] 102.1 F (38.9 C) (05/14 0645) Pulse Rate:  [72-150] 109 (05/14 0800) Resp:  [17-62] 35 (05/14 0800) BP: (66-203)/(18-79) 132/66 mmHg (05/14  0800) SpO2:  [90 %-99 %] 94 % (05/14 0800) FiO2 (%):  [40 %] 40 % (05/14 0800) Weight:  [107.7 kg (237 lb 7 oz)] 107.7 kg (237 lb 7 oz) (05/14 0330)  HEMODYNAMICS: CVP:  [10 mmHg-31 mmHg] 10 mmHg  VENTILATOR SETTINGS: Vent Mode:  [-] PRVC FiO2 (%):  [40 %] 40 % Set Rate:  [20 bmp] 20 bmp Vt Set:  [600 mL] 600 mL PEEP:  [5 cmH20] 5 cmH20 Pressure Support:  [10 cmH20] 10 cmH20 Plateau Pressure:  [23 cmH20-27 cmH20] 26 cmH20  INTAKE / OUTPUT: Intake/Output     05/13 0701 - 05/14 0700 05/14 0701 - 05/15 0700   I.V. (mL/kg) 1580.8 (14.7)    NG/GT 870    IV Piggyback 775    Total Intake(mL/kg) 3225.8 (30)    Urine (mL/kg/hr) 725 (0.3)    Emesis/NG output 1050 (0.4)    Other 500 (0.2)    Total Output 2275     Net +950.8          Stool Occurrence 3 x      PHYSICAL EXAMINATION: General: Looks moribundly and critically ill Neuro:  Not responsive, on precedex gtt (was beginning to respond yesterday) HEENT:  PERRL, OETT / OGT Cardiovascular:  RRR, no m/r/g, tachycardic Lungs:  Bilateral diminished air entry, few rhonchi Abdomen: , bowel sounds diminished. Seems to have more ascites +.  More distended Musculoskeletal:  With tense edema Skin:  Intact  LABS:  PULMONARY  Recent Labs Lab 01/21/2014 1618 01/17/2014 1900 01/24/14 1827 01/26/14 0412  PHART 7.138* 7.346* 7.414 7.390  PCO2ART 48.1* 33.8* 24.5* 32.0*  PO2ART 145.0* 82.0 50.0* 108.0*  HCO3 15.6* 18.5* 15.7* 19.3*  TCO2 15._0 O2SAT 97.8 96.0 86.0 98.0    CBC  Recent Labs Lab 01/27/14 0400 01/28/14 0450 02/08/2014 0400  HGB 8.3* 9.0* 10.7*  HCT 26.0* 28.5* 33.3*  WBC 14.4* 17.9* 17.4*  PLT 91* 123* PLATELET CLUMPS NOTED ON SMEAR, COUNT APPEARS ADEQUATE    COAGULATION  Recent Labs Lab 01/16/2014 1140 02/07/2014 1835 01/27/14 0400 02/10/2014 0400  INR 1.42 1.60* 1.74* 1.58*    CARDIAC    Recent Labs Lab 01/27/14 0400  TROPONINI <0.30    Recent Labs Lab 01/28/14 0450  PROBNP 2100.0*      CHEMISTRY  Recent Labs Lab 01/26/14 0031  01/27/14 0400 01/27/14 1700 01/27/14 1950 01/28/14 0450 01/28/14 1700 02/02/2014 0400  NA 147  --  145  --  147 148*  --  151*  K 3.4*  --  2.9*  --  3.7 3.4*  --  4.0  CL 118*  --  117*  --  117* 117*  --  115*  CO2 19  --  20  --  19 19  --  17*  GLUCOSE 126*  --  165*  --  170* 149*  --  133*  BUN 14  --  8  --  10 12  --  21  CREATININE 0.84  --  0.63  --  0.74 0.70  --  0.82  CALCIUM 7.4*  --  7.2*  --  7.8* 8.2*  --  8.9  MG 1.5  --  1.7  --   --  2.0  --  1.9  PHOS 1.6*  < > 1.9* 1.7*  --  1.8* 3.2 4.4  < > = values in this interval not displayed. Estimated Creatinine Clearance: 106.2 ml/min (by C-G formula based on Cr of 0.82).   LIVER  Recent Labs Lab 01/26/2014 1140 02/04/2014 0442 01/17/2014 1835 01/25/14 0951 01/27/14 0400 02/04/2014 0400  AST 36 25 51* 49*  --  53*  ALT _1 --  22  ALKPHOS 113 72 81 63  --  73  BILITOT 1.3* 1.8* 3.5* 2.3*  --  6.0*  PROT 7.0 4.9* 5.2* 4.5*  --  5.6*  ALBUMIN 2.6* 2.0* 2.1* 1.8*  --  2.5*  INR 1.42  --  1.60*  --  1.74* 1.58*     INFECTIOUS  Recent Labs Lab 01/25/14 1705 01/26/14 0031 01/28/14 1100 02/10/2014 0400  LATICACIDVEN 0.2* 1.4  --  5.2*  PROCALCITON  --   --  0.13 0.21     ENDOCRINE CBG (last 3)   Recent Labs  01/28/14 1902 01/26/2014 0004 01/19/2014 0353  GLUCAP 157* 139* 121*         IMAGING x48h  Dg Chest Port 1 View  02/03/2014   CLINICAL DATA:  Evaluate endotracheal tube positioning  EXAM: PORTABLE CHEST - 1 VIEW  COMPARISON:  DG CHEST 1V PORT dated 01/28/2014; DG CHEST 1V PORT dated 01/27/2014; DG CHEST 1V PORT dated 01/25/2014; DG CHEST PORT 1VSAME DAY dated 01/18/2014  FINDINGS: Grossly unchanged cardiac silhouette and mediastinal contours given persistently reduced lung volumes patient rotation. Post median sternotomy and CABG. Stable position of support apparatus. The pulmonary vasculature is indistinct with cephalization of flow.  Worsening perihilar and bilateral medial basilar heterogeneous opacities, left greater than right. Trace bilateral effusions suspected. No pneumothorax. Unchanged bones.  IMPRESSION: 1.  Stable positioning  of support apparatus.  No pneumothorax. 2. Findings suggestive of worsening pulmonary edema and perihilar/bibasilar atelectasis, left greater than right.   Electronically Signed   By: Sandi Mariscal M.D.   On: 02/15/2014 07:51   Dg Chest Port 1 View  01/28/2014   CLINICAL DATA:  Hypoxia  EXAM: PORTABLE CHEST - 1 VIEW  COMPARISON:  Jan 27, 2014  FINDINGS: Endotracheal tube tip is 3.3 cm above the carina. Central catheter tip is at cavoatrial junction. Nasogastric tube tip and side-port in the stomach. No pneumothorax.  The there is persistent interstitial edema. There has been slight interval clearing in the right upper lobe compared to 1 day prior. No new opacity. Heart is upper normal in size. The pulmonary vascularity is within normal limits.  IMPRESSION: Tube and catheter positions as described without pneumothorax. Persistent interstitial edema. Slight interval clearing right upper lobe. No new opacity.   Electronically Signed   By: Lowella Grip M.D.   On: 01/28/2014 07:51   Ct Portable Head W/o Cm  01/27/2014   CLINICAL DATA:  Post cardiac arrest.  EXAM: CT HEAD WITHOUT CONTRAST  TECHNIQUE: Contiguous axial images were obtained from the base of the skull through the vertex without intravenous contrast.  COMPARISON:  None.  FINDINGS: No intracranial hemorrhage.  Small vessel disease type changes.  Taking into account motion and portable technique, no CT evidence of large acute infarct or diffuse anoxia.  No hydrocephalus.  No intracranial mass lesion noted on this unenhanced exam.  Partial opacification mastoid air cells bilaterally. No definitive mass at the level of drainage of the eustachian tube (posterior superior nasopharynx) although incompletely assessed.  IMPRESSION: No intracranial  hemorrhage.  Small vessel disease type changes.  Taking into account motion and portable technique, no CT evidence of large acute infarct or diffuse anoxia.  Partial opacification mastoid air cells bilaterally.  Please see above.   Electronically Signed   By: Chauncey Cruel M.D.   On: 01/27/2014 14:39      ASSESSMENT / PLAN:  PULMONARY A: Acute respiratory failure Aspiration pneumonia / pneumonitis Difficult intubation    - 40% fio2, peep 5. Does not meet SBT criteria due to encephalopathy and pressor need and ongoing GI bleed  P:   Continuous mechanical support VAP bundle Daily SBT Trend ABG/CXR Albuterol / Atrovent  CARDIOVASCULAR A:  Hypovolemic / hemorrhagic / septic shock Cardiac arrest in setting of hemorrhagic shock, ROSC after 12 minutes of CPR   -  In Hemorrhagic shock needing levophed. On Precedex  P:  Goal MAP > 65 Levophed gtt Precedex to stop; see CNS for sedation changes CBC Q4h Stat 2 U PRBC    RENAL A:   Mild hypomag Mild hypernatremia   P:   Replete mag  Trend BMP NS_0   CVP monitor  GASTROINTESTINAL A:   Alcoholic cirrhosis  - Variceal hemorrhage s/p unsuccessfully bending and successful TIPS  And large volume paracentesis 01/24/14, s.p 1 dose albumin 01/26/14    - recurrent Upper GI bleed 01/19/2014, s/p 4 x albumin 01/28/14  P:   NG to LIS Transfuse 2 U PRBC GI services  Clarise Cruz Gribbiin Called Contninue  Lactulose and  Rifaximin NPO Continue TF Start protonix gtt Start Octreotide gtt   HEMATOLOGIC A:   Acute blood loss anemia Coagulopathy Thrombocytopenia  - acute, possibly due to liver disease (baseline > 125)  VTE Px    - Ongoing acute blood loss anemia  P:  SCD PRBC for hgb < 7gm%; other than  hemorrhage - so give 2U PRBC stat Monitor platelets (no heparin) REcheck CBC q4h   INFECTIOUS A:   Possible aspiration pneumonia At risk for SBP P:   Abx as above  ENDOCRINE A:   Hyperglycemia No h/o DM Unknown  adrenal function P:   Glycemic protocol Phase 1 Cortisol level  NEUROLOGIC A:   Alcohol abuse Acute encephalopathy  -  hepatic and ICU encephalopathy persists P:   DC precedex  Fentanyl / Versed PRN -> start Drip Thiamin / Folate Lactulose  To continue Rifaximin to continue    GLOBAL 01/26/14: wife updated  01/28/14: wife and cousin updated.  Explained he is heading into chronic critical illness. Advised long haul outcome likely. Will address LTAC/trach issues in few days depending on course   02/09/2014: Massive recurrent GI bleed. GI now on site. Met with wife, sister and cousin: all very emotional. They do see that prognosis is grim and chance of survival is very poor. They are leaning towards DNAR but continued medical intervention to stop bleeding. They will confirm DNAR after daughter arrives in 90 minutes. Place 2nd CVL   The patient is critically ill with multiple organ systems failure and requires high complexity decision making for assessment and support, frequent evaluation and titration of therapies, application of advanced monitoring technologies and extensive interpretation of multiple databases.   Critical Care Time devoted to patient care services described in this note is  60  Minutes.  Dr. Brand Males, M.D., [facility].C.P Pulmonary and Critical Care Medicine Staff Physician [place] [facility] Pager: [phone], If no answer or between  15:00h - 7:00h: call [phone]  01/20/2014 8:37 AM

## 2014-01-29 NOTE — Progress Notes (Signed)
Patient's wife came out of conference room after speaking with other family members, she states she does not want to shock him or do CPR. She states "if he gets into trouble again with the bleeding to let him go". Dr. Chase Caller notified but is off the floor. Nurse practitioner on unit is able to put order in for no CPR/Defib/Antiarrythmic drugs. Verbal order from Dr. Chase Caller to continue current treatment with ventilator and pressors only.   Tawana Scale [facility]

## 2014-01-29 NOTE — Progress Notes (Signed)
ANTIBIOTIC CONSULT NOTE - FOLLOW UP  Pharmacy Consult for Zosyn Indication: aspiration PNA  No Known Allergies  Patient Measurements: Height: 5\' 6"  (167.6 cm) Weight: 237 lb 7 oz (107.7 kg) IBW/kg (Calculated) : 63.8   Vital Signs: Temp: 101 F (38.3 C) (05/14 1123) Temp src: Core (Comment) (05/14 1123) BP: 136/59 mmHg (05/14 1123) Pulse Rate: 115 (05/14 1123) Intake/Output from previous day: 05/13 0701 - 05/14 0700 In: 3225.8 [I.V.:1580.8; NG/GT:870; IV Piggyback:775] Out: 2275 [Urine:725; Emesis/NG output:1050] Intake/Output from this shift: Total I/O In: 684.7 [Blood:684.7] Out: -   Labs:  Recent Labs  01/28/14 0450 2014-02-13 0400 02/13/2014 0845  WBC 17.9* 17.4* 10.9*  HGB 9.0* 10.7* 10.1*  PLT 123* PLATELET CLUMPS NOTED ON SMEAR, COUNT APPEARS ADEQUATE 123*  CREATININE 0.70 0.82 1.35   Estimated Creatinine Clearance: 64.5 ml/min (by C-G formula based on Cr of 1.35). No results found for this basename: VANCOTROUGH, Corlis Leak, VANCORANDOM, GENTTROUGH, GENTPEAK, GENTRANDOM, TOBRATROUGH, TOBRAPEAK, TOBRARND, AMIKACINPEAK, AMIKACINTROU, AMIKACIN,  in the last 72 hours   Microbiology: Recent Results (from the past 720 hour(s))  MRSA PCR SCREENING     Status: None   Collection Time    Feb 07, 2014  7:45 PM      Result Value Ref Range Status   MRSA by PCR NEGATIVE  NEGATIVE Final   Comment:            The GeneXpert MRSA Assay (FDA     approved for NASAL specimens     only), is one component of a     comprehensive MRSA colonization     surveillance program. It is not     intended to diagnose MRSA     infection nor to guide or     monitor treatment for     MRSA infections.    Anti-infectives   Start     Dose/Rate Route Frequency Ordered Stop   01/27/14 1315  rifaximin (XIFAXAN) tablet 550 mg     550 mg Oral Every 12 hours 01/27/14 1240     01/24/14 0145  piperacillin-tazobactam (ZOSYN) IVPB 3.375 g     3.375 g 12.5 mL/hr over 240 Minutes Intravenous 3 times per  day 01/24/14 0142     01/25/2014 1830  cefTRIAXone (ROCEPHIN) 1 g in dextrose 5 % 50 mL IVPB  Status:  Discontinued     1 g 100 mL/hr over 30 Minutes Intravenous Every 24 hours 02/07/2014 1829 01/24/14 0107      Assessment: 78 YOM who coded on Feb 08, 2023 and was started on Zosyn for possible aspiration pneumonia. WBC= 17.4 and 10.9 today, SCr= 0.82 and 1.35, tmax= 102.2. Patient noted with hemorrhagic shock  And GIB now on protonix and octreotide drip. Noted for EGD today.  Goal of Therapy:  Eradication of infection  Plan:  1. Continue Zosyn 3.375g IV q8h EI 2. Follow for renal function, clinical progression, LOT  Hildred Laser, Pharm D 13-Feb-2014 11:38 AM

## 2014-01-29 NOTE — Progress Notes (Signed)
Pt agitated, increased temp, increased WOB.  Dr Chase Caller aware- in to see pt.  RN starting sedation.

## 2014-01-29 NOTE — Progress Notes (Signed)
Chaplain responded to request to support pt's family. Dr. Chase Caller had just informed family that pt has internal bleeding and they were devastated by this news. I found pt's wife and pt's two sisters in conference room. Provided emotional support and empathic listening as they cried and assimilated this new development. Prayed with pt's wife. Pt has been here almost a week and family was hopeful pt could be weaned from ventilator today. Family shared stories showing their fondness for pt. Pt will be going for endoscopy to find source of bleeding. Accompanied pt's wife to pt's room briefly and them back to conference room. Stayed with family until sisters' husbands arrived and then encouraged pt's wife to request further chaplain support if needed.

## 2014-01-29 NOTE — Progress Notes (Signed)
Pt sat 88-90% on 40%, increased to 50%, RN aware.  Sat 93-97 now on 50%

## 2014-01-29 NOTE — Progress Notes (Signed)
Repeat family discussion with daughter and many more family members  They all understand he is gravely ill  They all agree and feel that CPR will be medically ineffective procedure and that patient himself would not want it. However, they cannot seem to commit to NO CPR. They want some more time to process and pray about it.    Dr. Brand Males, M.D., [facility].C.P Pulmonary and Critical Care Medicine Staff Physician [place] [facility] Pager: [phone], If no answer or between  15:00h - 7:00h: call [phone]  02/05/2014 2:47 PM

## 2014-01-30 ENCOUNTER — Encounter (HOSPITAL_COMMUNITY): Payer: Self-pay | Admitting: Gastroenterology

## 2014-01-30 ENCOUNTER — Inpatient Hospital Stay (HOSPITAL_COMMUNITY): Payer: BC Managed Care – PPO

## 2014-01-30 DIAGNOSIS — E872 Acidosis, unspecified: Secondary | ICD-10-CM

## 2014-01-30 DIAGNOSIS — R6521 Severe sepsis with septic shock: Secondary | ICD-10-CM

## 2014-01-30 DIAGNOSIS — R4020 Unspecified coma: Secondary | ICD-10-CM

## 2014-01-30 DIAGNOSIS — N179 Acute kidney failure, unspecified: Secondary | ICD-10-CM

## 2014-01-30 DIAGNOSIS — Z515 Encounter for palliative care: Secondary | ICD-10-CM

## 2014-01-30 DIAGNOSIS — A419 Sepsis, unspecified organism: Secondary | ICD-10-CM

## 2014-01-30 DIAGNOSIS — Z66 Do not resuscitate: Secondary | ICD-10-CM

## 2014-01-30 LAB — BASIC METABOLIC PANEL
BUN: 38 mg/dL — ABNORMAL HIGH (ref 6–23)
CALCIUM: 8 mg/dL — AB (ref 8.4–10.5)
CHLORIDE: 111 meq/L (ref 96–112)
CO2: 13 meq/L — AB (ref 19–32)
Creatinine, Ser: 2.29 mg/dL — ABNORMAL HIGH (ref 0.50–1.35)
GFR calc Af Amer: 33 mL/min — ABNORMAL LOW (ref >90)
GFR calc non Af Amer: 29 mL/min — ABNORMAL LOW (ref >90)
Glucose, Bld: 73 mg/dL (ref 70–99)
Potassium: 4.5 mEq/L (ref 3.7–5.3)
SODIUM: 150 meq/L — AB (ref 137–147)

## 2014-01-30 LAB — PREPARE FRESH FROZEN PLASMA: Unit division: 0

## 2014-01-30 LAB — CBC WITH DIFFERENTIAL/PLATELET
Basophils Absolute: 0 10*3/uL (ref 0.0–0.1)
Basophils Relative: 0 % (ref 0–1)
EOS PCT: 0 % (ref 0–5)
Eosinophils Absolute: 0 10*3/uL (ref 0.0–0.7)
HCT: 35 % — ABNORMAL LOW (ref 39.0–52.0)
Hemoglobin: 11 g/dL — ABNORMAL LOW (ref 13.0–17.0)
Lymphocytes Relative: 5 % — ABNORMAL LOW (ref 12–46)
Lymphs Abs: 1.5 10*3/uL (ref 0.7–4.0)
MCH: 29.2 pg (ref 26.0–34.0)
MCHC: 31.4 g/dL (ref 30.0–36.0)
MCV: 92.8 fL (ref 78.0–100.0)
MONO ABS: 2.4 10*3/uL — AB (ref 0.1–1.0)
Monocytes Relative: 8 % (ref 3–12)
NEUTROS PCT: 87 % — AB (ref 43–77)
Neutro Abs: 25.7 10*3/uL — ABNORMAL HIGH (ref 1.7–7.7)
Platelets: ADEQUATE 10*3/uL (ref 150–400)
RBC: 3.77 MIL/uL — AB (ref 4.22–5.81)
RDW: 19.7 % — ABNORMAL HIGH (ref 11.5–15.5)
WBC MORPHOLOGY: INCREASED
WBC: 29.6 10*3/uL — ABNORMAL HIGH (ref 4.0–10.5)

## 2014-01-30 LAB — CBC
HCT: 36.6 % — ABNORMAL LOW (ref 39.0–52.0)
Hemoglobin: 11.6 g/dL — ABNORMAL LOW (ref 13.0–17.0)
MCH: 29.4 pg (ref 26.0–34.0)
MCHC: 31.7 g/dL (ref 30.0–36.0)
MCV: 92.9 fL (ref 78.0–100.0)
PLATELETS: 125 10*3/uL — AB (ref 150–400)
RBC: 3.94 MIL/uL — AB (ref 4.22–5.81)
RDW: 19.3 % — ABNORMAL HIGH (ref 11.5–15.5)
WBC: 24.6 10*3/uL — ABNORMAL HIGH (ref 4.0–10.5)

## 2014-01-30 LAB — PROCALCITONIN: Procalcitonin: 7.2 ng/mL

## 2014-01-30 LAB — PROTIME-INR
INR: 1.79 — ABNORMAL HIGH (ref 0.00–1.49)
Prothrombin Time: 20.3 seconds — ABNORMAL HIGH (ref 11.6–15.2)

## 2014-01-30 LAB — TYPE AND SCREEN
ABO/RH(D): O POS
Antibody Screen: NEGATIVE
UNIT DIVISION: 0
Unit division: 0

## 2014-01-30 LAB — GLUCOSE, CAPILLARY
Glucose-Capillary: 55 mg/dL — ABNORMAL LOW (ref 70–99)
Glucose-Capillary: 74 mg/dL (ref 70–99)
Glucose-Capillary: 86 mg/dL (ref 70–99)
Glucose-Capillary: 93 mg/dL (ref 70–99)

## 2014-01-30 LAB — PHOSPHORUS: PHOSPHORUS: 6.8 mg/dL — AB (ref 2.3–4.6)

## 2014-01-30 LAB — MAGNESIUM: MAGNESIUM: 2.1 mg/dL (ref 1.5–2.5)

## 2014-01-30 LAB — TROPONIN I: Troponin I: <0.3 ng/mL (ref <0.30)

## 2014-01-30 LAB — LACTIC ACID, PLASMA: Lactic Acid, Venous: 11 mmol/L — ABNORMAL HIGH (ref 0.5–2.2)

## 2014-01-30 MED ORDER — DEXTROSE 50 % IV SOLN
INTRAVENOUS | Status: AC
Start: 2014-01-30 — End: 2014-01-30
  Administered 2014-01-30: 25 mL via INTRAVENOUS
  Filled 2014-01-30: qty 50

## 2014-01-30 MED ORDER — FENTANYL BOLUS VIA INFUSION
50.0000 ug | INTRAVENOUS | Status: DC | PRN
  Filled 2014-01-30: qty 100

## 2014-01-30 MED ORDER — DEXTROSE 50 % IV SOLN
25.0000 mL | Freq: Once | INTRAVENOUS | Status: AC | PRN
  Administered 2014-01-30: 25 mL via INTRAVENOUS

## 2014-01-30 MED ORDER — SODIUM CHLORIDE 0.9 % IV SOLN
0.0000 ug/h | INTRAVENOUS | Status: DC
  Filled 2014-01-30: qty 50

## 2014-02-11 NOTE — Discharge Summary (Signed)
DISCHARGE SUMMARY    Date of admit: 02-21-14 11:23 AM Date of discharge: 02/02/2014  3:47 PM Length of Stay: 8 days  PCP is Earney Mallet, MD   PROBLEM LIST Principal Problem:   Hematemesis Active Problems:   Mixed hyperlipidemia   HYPERTENSION, UNSPECIFIED   CAD, NATIVE VESSEL   LUNG NODULE   GERD (gastroesophageal reflux disease)   Tachycardia   Anemia   Hyperglycemia   UGI bleed   Varices, esophageal   Serum total bilirubin elevated   Serum ammonia increased   Hypotension, unspecified   Hypovolemic shock   Acute respiratory failure   Shock   Aspiration pneumonia   Acute renal failure   Septic shock(785.52)   Lactic acid acidosis   Coma   DNAR (do not attempt resuscitation)   Terminal care   Palliative care status    SUMMARY [name] was 63 y.o. patient with    has a past medical history of CAD, multiple vessel; GERD (gastroesophageal reflux disease); Hyperlipidemia; Esophageal stricture; and ETOH abuse.   has past surgical history that includes Coronary artery bypass graft (11/2008); Hemorroidectomy; Laminectomy; Inguinal hernia repair; Splenectomy, partial; Esophagogastroduodenoscopy (N/A, Feb 21, 2014); Esophagogastroduodenoscopy (N/A, 02/08/2014); and Esophagogastroduodenoscopy (egd) with propofol (N/A, 02/04/2014).   Admitted on February 21, 2014 with   63 yo with alcoholic liver cirrhosis whop res[facility]. Intubated. EGD with grade III esophageal varices, unsuccessful banding. Briefly coded, resuscitated. Transferred to [facility] for emergent TIPS by IR.    SIGNIFICANT EVENTS / STUDIES:  5/7 - Ammonia 154  5/8 EGD >>> esophageal varices, unsuccessful bending  5/9 IR >>> TIPS with decrease in portal pressure, paracentesis  01/25/14: Hypotensive overnight requiring vasopressors. Ammonia 80  01/26/14: On lactulose 30 tid but no BM. On prn fent and versed: occ follows command with ??? Rt side weakness per RN. RASS -1; improved. No  overt bleeding. 1 x 25gm albumin  01/27/14: On small dose of levophed. No overt bleeding. On 40% fio2. On precedex gtt + prn fent/versed -> gets agitated. Now having bowel movements after lactulose increased. Ammonia 81. On tube feeds  01/28/14: Stll on 79mcg levophed. On 40% fi02. On precdex -> wua gets agitated (apparently followed some commands last night) but moved all 4s. Good bowel movement  02/05/2014: Massive recurrence of hemoptysis few hours ago followed by circulatory shock and lactic acidosis at 5.2. Abdomen more distended.  01/24/2014: Bleeding appears to have stabilized per GI but worsening renal failure, lactic acidosis, and hypoxemia to 60%; likely septic shock. On 62mcg levophed. MODS. Marland Kitchen Worsening coags  On 02/09/2014 had worsening shock, renal failure. Goals of care discussion held and patient terminally weaned and expired 01/26/2014       SIGNED Dr. Brand Males, M.D., F.C.C.P Pulmonary and Critical Care Medicine Staff Physician [place] [facility] Pager: [phone], If no answer or between  15:00h - 7:00h: call [phone]  02/11/2014 5:36 PM

## 2014-02-16 NOTE — Progress Notes (Signed)
Chaplain requested by RNs to provide grief support for the family before/during/after pt's withdrawal of care. Chaplain met with pt's daughter, wife, sisters, and other family, providing grief support through empathic listening, pastoral presence, and prayer. Present during withdrawal. Family was at patient's bedside. Will follow up in the afternoon. Please page if requested.   Ethelene Browns [phone]

## 2014-02-16 NOTE — Progress Notes (Signed)
Chaplain followed up with family and provided additional grief support. Pt's wife said, "Now we are waiting." Several times, she has referenced pt "is going home," "is going to heaven," she said she draws hope from him being reunited with with his parents and with God. Chaplain will follow up, please page if needed.   Ethelene Browns [phone]

## 2014-02-16 NOTE — Progress Notes (Signed)
Chaplain followed up with family at time of patient's death. Provided grief support, compassionate presence and prayer. Family appreciative of support and care, preparing to leave.   Ethelene Browns [phone]

## 2014-02-16 NOTE — Progress Notes (Signed)
[facility] Progress Note    Since last GI note: EGD yesterday, no active bleeding sites but there was a lot of blood in UGI tract.  I suspect the recurrent bleeding is related to his varices, portal gastropathy but no focal sites noted and his TIPS appears to be functioning. The previously large varices are much smaller as well.    Objective: Vital signs in last 24 hours: Temp:  [98.4 F (36.9 C)-102.2 F (39 C)] 101.4 F (38.6 C) (05/15 0410) Pulse Rate:  [95-128] 127 (05/15 0800) Resp:  [18-36] 21 (05/15 0800) BP: (83-137)/(40-89) 106/46 mmHg (05/15 0800) SpO2:  [89 %-99 %] 93 % (05/15 0800) FiO2 (%):  [40 %-60 %] 60 % (05/15 0321) Weight:  [229 lb 15 oz (104.3 kg)] 229 lb 15 oz (104.3 kg) (05/15 0515) Last BM Date: 01/28/14 General: intubated, sedated Heart: regular rate and rythm Abdomen: soft, non-tender, non-distended, normal bowel sounds   Lab Results:  Recent Labs  01/24/2014 2040 02/11/2014 0014 01/24/2014 0447  WBC 22.3* 24.6* 29.6*  HGB 11.1* 11.6* 11.0*  PLT 103* 125* PLATELET CLUMPS NOTED ON SMEAR, COUNT APPEARS ADEQUATE  MCV 91.1 92.9 92.8    Recent Labs  01/21/2014 0400 01/16/2014 0845 02/04/2014 0447  NA 151* 147 150*  K 4.0 4.0 4.5  CL 115* 110 111  CO2 17* 16* 13*  GLUCOSE 133* 147* 73  BUN 21 24* 38*  CREATININE 0.82 1.35 2.29*  CALCIUM 8.9 8.5 8.0*    Recent Labs  02/01/2014 0400 02/10/2014 0845  PROT 5.6* 5.4*  ALBUMIN 2.5* 2.5*  AST 53* 47*  ALT 22 20  ALKPHOS 73 68  BILITOT 6.0* 6.4*  BILIDIR 2.8* 3.3*  IBILI 3.2* 3.1*    Recent Labs  02/08/2014 0845 01/20/2014 0447  INR 1.72* 1.79*    Medications: Scheduled Meds: . antiseptic oral rinse  15 mL Mouth Rinse QID  . chlorhexidine  15 mL Mouth Rinse BID  . feeding supplement (VITAL HIGH PROTEIN)  1,000 mL Per Tube Q24H  . fentaNYL  50 mcg Intravenous Once  . folic acid  1 mg Per Tube Daily  . insulin aspart  2-6 Units Subcutaneous 6 times per day  . lactulose  30 g Oral  Q6H  . [START ON 02/01/2014] pantoprazole (PROTONIX) IV  40 mg Intravenous Q12H  . piperacillin-tazobactam (ZOSYN)  IV  3.375 g Intravenous 3 times per day  . rifaximin  550 mg Oral Q12H  . sodium chloride  10-40 mL Intracatheter Q12H  . thiamine  100 mg Per Tube Daily   Continuous Infusions: . sodium chloride 50 mL/hr at 01/24/2014 0700  . fentaNYL infusion INTRAVENOUS 75 mcg/hr (02/12/2014 0138)  . midazolam (VERSED) infusion Stopped (02/10/2014 0028)  . norepinephrine (LEVOPHED) Adult infusion 40 mcg/min (01/25/2014 0700)  . octreotide (SANDOSTATIN) infusion 25 mcg/hr (02/10/2014 0517)  . pantoprozole (PROTONIX) infusion 8 mg/hr (01/26/2014 0625)   PRN Meds:.fentaNYL, fentaNYL, midazolam, ondansetron (ZOFRAN) IV, sodium chloride, sodium chloride    Assessment/Plan: 63 y.o. male with cirrhosis, portal hypertension, variceal bleed  TIPS appears patent, functioning.  I cannot clearly explain his recurrent bleeding yesterday but I suspect it is related to his known portal hypertension, varices, gastropathy.  No sites of PUD noted.  Seems like bleeding has stopped clinically however he continues to require pressors.  Cr is increasing. O2 requirement increasing.  For now, recommend continuing full supportive care with IV PPI drip, IV octreotide.  Will continue to follow.    Milus Banister, MD  02/03/2014, 8:05 AM [facility] Pager [phone]

## 2014-02-16 NOTE — Progress Notes (Signed)
Patient asystole on monitor at nurses station. Patient's wife and family present at bedside. Notified them of asytole. Verified by second nurse Shea Stakes, RN. No palpable pulse and no auscultation of breath sounds bilaterally. Mable Fill was present at time of death to provide family support. Dr. Chase Caller notified of time of death as 15:47. Patient's family declined autopsy and patient does not meet criteria for medical examiners case per hospital guidelines. Support was offered to family and no other needs were expressed at this time. 64ml of Versed and 14ml of Fentanyl were wasted in trash can, witnesses by Livia Snellen, RN. Patient's family took all of the patient's belongings. [facility] was notified of death and determined that the patient was not a suitable candidate for donation.

## 2014-02-16 NOTE — Procedures (Signed)
Extubation Procedure Note  Patient Details:   Name: [name] DOB: 05/11/1951 MRN: [id number]   Airway Documentation:     Evaluation  Pt terminally extubated per MD order.    Mariam Dollar Feb 04, 2014, 12:12 PM

## 2014-02-16 NOTE — Progress Notes (Signed)
PULMONARY / CRITICAL CARE MEDICINE  Name: [name] MRN: [id number] DOB: [birth year]   PCP Earney Mallet, MD    ADMISSION DATE:  02/11/2014 CONSULTATION DATE:  02/08/2014 LOS 8 days   REFERRING MD :  Transfer from [facility] PRIMARY SERVICE: PCCM  CHIEF COMPLAINT:  Hemorrhagic shock  BRIEF PATIENT DESCRIPTION: 63 yo with alcoholic liver cirrhosis whop resented with hematemesis to [facility]. Intubated. EGD with grade III esophageal varices, unsuccessful banding. Briefly coded, resuscitated. Transferred to [facility] for emergent TIPS by IR.   LINES / TUBES: OETT 5/8 >>> [place] tube 5/8 >>> 5/9 Foley 5/8 >>> R PICC ??? >>> R IJ CVL 5/9 >>>5/11  CULTURES: 5/8  MRSA PCR >>> neg  ANTIBIOTICS: Zosyn 5/8  >>>  Rifaximin (hep enceph) 5/12 >>  SIGNIFICANT EVENTS / STUDIES:  5/7 - Ammonia 154 5/8  EGD >>> esophageal varices, unsuccessful bending 5/9  IR >>> TIPS with decrease in portal pressure, paracentesis 01/25/14: Hypotensive overnight requiring vasopressors. Ammonia 80 01/26/14: On lactulose 30 tid but no BM. On prn fent and versed: occ follows command with ??? Rt side weakness per RN. RASS -1; improved. No overt bleeding.  1 x 25gm albumin  01/27/14: On small dose of levophed. No overt bleeding. On 40% fio2. On precedex gtt + prn fent/versed -> gets agitated. Now having bowel movements after lactulose increased. Ammonia 81. On tube feeds   01/28/14: Stll on 74mcg levophed. On 40% fi02. On precdex -> wua gets agitated (apparently followed some commands last night) but moved all 4s. Good bowel movement   02/12/2014: Massive recurrence of hemoptysis few hours ago followed by circulatory shock and lactic acidosis at 5.2. Abdomen more distended.     SUBJECTIVE/OVERNIGHT/INTERVAL HX  02/16/14: Bleeding appears to have stabilized per GI but worsening renal failure, lactic acidosis, and hypoxemia to 60%; likely septic shock. On 10mcg levophed. MODS. Marland Kitchen Worsening coags  VITAL SIGNS: Temp:   [98.4 F (36.9 C)-101.4 F (38.6 C)] 100.9 F (38.3 C) (05/15 0900) Pulse Rate:  [95-132] 132 (05/15 1000) Resp:  [18-30] 21 (05/15 1000) BP: (83-137)/(40-89) 98/47 mmHg (05/15 1000) SpO2:  [89 %-99 %] 90 % (05/15 1000) FiO2 (%):  [40 %-60 %] 60 % (05/15 0819) Weight:  [104.3 kg (229 lb 15 oz)] 104.3 kg (229 lb 15 oz) (05/15 0515)  HEMODYNAMICS: CVP:  [5 mmHg-10 mmHg] 5 mmHg  VENTILATOR SETTINGS: Vent Mode:  [-] PRVC FiO2 (%):  [40 %-60 %] 60 % Set Rate:  [20 bmp] 20 bmp Vt Set:  [600 mL] 600 mL PEEP:  [5 cmH20] 5 cmH20 Plateau Pressure:  [23 cmH20-31 cmH20] 24 cmH20  INTAKE / OUTPUT: Intake/Output     05/14 0701 - 05/15 0700 05/15 0701 - 05/16 0700   I.V. (mL/kg) 2794.4 (26.8) 296.9 (2.8)   Blood 1167.5    NG/GT 280    IV Piggyback 112.5    Total Intake(mL/kg) 4354.4 (41.7) 296.9 (2.8)   Urine (mL/kg/hr) 295 (0.1) 70 (0.2)   Emesis/NG output 1350 (0.5)    Other 850 (0.3)    Total Output 2495 70   Net +1859.4 +226.9          PHYSICAL EXAMINATION: General: Looks moribundly and critically ill Neuro:  Not responsive, even off sedation HEENT:  PERRL, OETT / OGT Cardiovascular:  RRR, no m/r/g, tachycardic Lungs:  Bilateral diminished air entry, few rhonchi. Worsening lung volumes Abdomen: , bowel sounds diminished. Seems to have more ascites +.  Worsening distenstion More distended Musculoskeletal:  With tense edema  Skin:  Intact  LABS:  PULMONARY  Recent Labs Lab 01/21/2014 1618 01/27/2014 1900 01/24/14 1827 01/26/14 0412 01/28/2014 0900  PHART 7.138* 7.346* 7.414 7.390 7.369  PCO2ART 48.1* 33.8* 24.5* 32.0* 26.1*  PO2ART 145.0* 82.0 50.0* 108.0* 94.0  HCO3 15.6* 18.5* 15.7* 19.3* 14.7*  TCO2 15.5 20 16 20 15   O2SAT 97.8 96.0 86.0 98.0 96.0    CBC  Recent Labs Lab 02/08/2014 2040 Feb 08, 2014 0014 02/08/14 0447  HGB 11.1* 11.6* 11.0*  HCT 34.9* 36.6* 35.0*  WBC 22.3* 24.6* 29.6*  PLT 103* 125* PLATELET CLUMPS NOTED ON SMEAR, COUNT APPEARS ADEQUATE     COAGULATION  Recent Labs Lab 02/13/2014 1835 01/27/14 0400 01/28/2014 0400 01/17/2014 0845 Feb 08, 2014 0447  INR 1.60* 1.74* 1.58* 1.72* 1.79*    CARDIAC    Recent Labs Lab 01/27/14 0400 02/01/2014 0851 2014/02/08 0447  TROPONINI <0.30 0.32* <0.30    Recent Labs Lab 01/28/14 0450  PROBNP 2100.0*     CHEMISTRY  Recent Labs Lab 01/26/14 0031  01/27/14 0400 01/27/14 1700 01/27/14 1950 01/28/14 0450 01/28/14 1700 02/08/2014 0400 01/18/2014 0845 02/08/14 0447  NA 147  --  145  --  147 148*  --  151* 147 150*  K 3.4*  --  2.9*  --  3.7 3.4*  --  4.0 4.0 4.5  CL 118*  --  117*  --  117* 117*  --  115* 110 111  CO2 19  --  20  --  19 19  --  17* 16* 13*  GLUCOSE 126*  --  165*  --  170* 149*  --  133* 147* 73  BUN 14  --  8  --  10 12  --  21 24* 38*  CREATININE 0.84  --  0.63  --  0.74 0.70  --  0.82 1.35 2.29*  CALCIUM 7.4*  --  7.2*  --  7.8* 8.2*  --  8.9 8.5 8.0*  MG 1.5  --  1.7  --   --  2.0  --  1.9  --  2.1  PHOS 1.6*  < > 1.9* 1.7*  --  1.8* 3.2 4.4  --  6.8*  < > = values in this interval not displayed. Estimated Creatinine Clearance: 37.4 ml/min (by C-G formula based on Cr of 2.29).   LIVER  Recent Labs Lab 02/12/2014 1835 01/25/14 0951 01/27/14 0400 01/21/2014 0400 02/03/2014 0845 Feb 08, 2014 0447  AST 51* 49*  --  53* 47*  --   ALT 19 21  --  22 20  --   ALKPHOS 81 63  --  73 68  --   BILITOT 3.5* 2.3*  --  6.0* 6.4*  --   PROT 5.2* 4.5*  --  5.6* 5.4*  --   ALBUMIN 2.1* 1.8*  --  2.5* 2.5*  --   INR 1.60*  --  1.74* 1.58* 1.72* 1.79*     INFECTIOUS  Recent Labs Lab 01/28/14 1100 02/11/2014 0400 02/10/2014 0900 08-Feb-2014 0447 2014-02-08 0449  LATICACIDVEN  --  5.2* 7.5*  --  11.0*  PROCALCITON 0.13 0.21  --  7.20  --      ENDOCRINE CBG (last 3)   Recent Labs  2014-02-08 0419 02-08-14 0832 Feb 08, 2014 0909  GLUCAP 74 55* 93         IMAGING x48h  Dg Chest 1 View  02/06/2014   CLINICAL DATA:  Central line placement  EXAM: CHEST - 1 VIEW   COMPARISON:  DG CHEST  1V PORT dated 01/27/2014  FINDINGS: Endotracheal tube NG tube and right central line unchanged. Addition of left internal jugular central line with tip over the superior vena cava in the region of the azygos arch. Tip is about 3 cm proximal to the cavoatrial junction. There is no pneumothorax.  Bilateral patchy opacities and interstitial prominence are stable.  IMPRESSION: New left central line as described above   Electronically Signed   By: Skipper Cliche M.D.   On: 02/01/2014 10:01   [place] Art/ven Flow Abd Pelv Doppler  02/06/2014   CLINICAL DATA:  Recurrent bleeding post TIPS.  EXAM: DUPLEX ULTRASOUND OF LIVER AND TIPS SHUNT  TECHNIQUE: Color and duplex Doppler ultrasound was performed to evaluate the hepatic in-flow and out-flow vessels.  COMPARISON:  02/06/2014  FINDINGS: TIPS Shunt Velocities  Proximal:  236 cm/sec  Mid:  2 63  Distal:  240 cm/sec  Portal Vein Velocities  Main:  Hepatopetal, 61 cm/sec  Right:  Not evaluated  Left:  Hepatofugal, 24 cm/sec  Varices:  None identified  Ascites: Mild recurrent ascites identified.  Other findings: None  IMPRESSION: 1. Patent TIPS with consistent velocities throughout, no evidence of stenosis or occlusion, and persistent reversal of intrahepatic left portal vein flow. 2. Mild recurrent abdominal ascites.   Electronically Signed   By: Arne Cleveland M.D.   On: 01/24/2014 14:29   Dg Chest Port 1 View  01/26/2014   CLINICAL DATA:  Endotracheal tube  EXAM: PORTABLE CHEST - 1 VIEW  COMPARISON:  DG CHEST 1 VIEW dated 02/06/2014; DG CHEST 1V PORT dated 01/28/2014; DG CHEST 1V PORT dated 01/26/2014; DG CHEST 1V PORT dated 01/25/2014  FINDINGS: Grossly unchanged cardiac silhouette and mediastinal contours with atherosclerotic plaque within the aortic arch given persistently reduced lung volumes. Post median sternotomy and CABG. Stable position of support apparatus. No pneumothorax. Improved aeration of the lungs with persistent perihilar and bibasilar  opacities. Unchanged trace bilateral effusions. Unchanged bones.  IMPRESSION: 1.  Stable positioning of support apparatus.  No pneumothorax. 2. Improved aeration along suggests resolving atelectasis and edema. 3. Persistent findings of hypoventilation, mild pulmonary venous congestion, trace bilateral effusions and perihilar/bibasilar atelectasis.   Electronically Signed   By: Sandi Mariscal M.D.   On: 01/26/2014 07:19   Dg Chest Port 1 View  02/02/2014   CLINICAL DATA:  Evaluate endotracheal tube positioning  EXAM: PORTABLE CHEST - 1 VIEW  COMPARISON:  DG CHEST 1V PORT dated 01/28/2014; DG CHEST 1V PORT dated 01/27/2014; DG CHEST 1V PORT dated 01/25/2014; DG CHEST PORT 1VSAME DAY dated 02/06/2014  FINDINGS: Grossly unchanged cardiac silhouette and mediastinal contours given persistently reduced lung volumes patient rotation. Post median sternotomy and CABG. Stable position of support apparatus. The pulmonary vasculature is indistinct with cephalization of flow. Worsening perihilar and bilateral medial basilar heterogeneous opacities, left greater than right. Trace bilateral effusions suspected. No pneumothorax. Unchanged bones.  IMPRESSION: 1.  Stable positioning of support apparatus.  No pneumothorax. 2. Findings suggestive of worsening pulmonary edema and perihilar/bibasilar atelectasis, left greater than right.   Electronically Signed   By: Sandi Mariscal M.D.   On: 02/09/2014 07:51      ASSESSMENT / PLAN:  PULMONARY A: Acute respiratory failure Aspiration pneumonia / pneumonitis Difficult intubation    - Worsening resp failure.   P:   Family agreed for terminal wean  CARDIOVASCULAR A:  Hypovolemic / hemorrhagic / septic shock Cardiac arrest in setting of hemorrhagic shock, ROSC after 12 minutes of CPR   -  Worsening shock; now septic   RENAL A:   New onset renal failure 02/18/14   GASTROINTESTINAL A:   Alcoholic cirrhosis  - Variceal hemorrhage s/p unsuccessfully bending and  successful TIPS  And large volume paracentesis 01/24/14, s.p 1 dose albumin 01/26/14    - recurrent Upper GI bleed 02/13/2014, s/p 4 x albumin 01/28/14. Not due to varices per GI/IR but likely portal gastropathy. On maximal medical Rx with protonix and octreotide and pressors.        HEMATOLOGIC A:   Acute blood loss anemia Coagulopathy Thrombocytopenia  - acute, possibly due to liver disease (baseline > 125)  VTE Px    - worsening coagulopathy   INFECTIOUS A:   Recurrent septic shock on max pressors February 18, 2014   NEUROLOGIC A:   Alcohol abuse Acute encephalopathy  -  hepatic and ICU encephalopathy persists  Comatose despite max medical rx 02/18/2014  P fent versed gtt   GLOBAL  02-18-14: Extensive bedside conference room discussion with wife, daughter, sister, cousin an 2 other members: they all understand he is dying and this no longer a survivable illness as explained by myself. They feel goal substituted judgement and best interest is to palliate. Process explained. Terminal wean later tday. Family in extreme grief and sorrow and very emotional about outcome but are realistic and grateful to care  The patient is critically ill with multiple organ systems failure and requires high complexity decision making for assessment and support, frequent evaluation and titration of therapies, application of advanced monitoring technologies and extensive interpretation of multiple databases.   Critical Care Time devoted to patient care services described in this note is  60  Minutes.  Dr. Brand Males, M.D., [facility].C.P Pulmonary and Critical Care Medicine Staff Physician [place] [facility] Pager: [phone], If no answer or between  15:00h - 7:00h: call [phone]  Feb 18, 2014 10:43 AM

## 2014-02-16 NOTE — Progress Notes (Signed)
Adult Hypoglycemia Protocol Treatment Guidelines  1. RN shall initiate Hypoglycemia Protocol emergency measures immediately when:            w        Routine or STAT CBG and/or a lab glucose indicates hypoglycemia (CBG < 70 mg/dl) Hypoglycemic Event  CBG: 55  Treatment: D50 IV 25 mL  Symptoms: None  Follow-up CBG: Time:0905 CBG Result:93  Possible Reasons for Event: Unknown  Comments/MD notified:Will discuss with rounding MD    [place] L Jolicia Delira  Remember to initiate Hypoglycemia Order Set & complete 2. Treat the patient according to ability to take PO's and severity of hypoglycemia.   3. If patient is on GlucoStabilizer, follow directions provided by the [facility] for hypoglycemic events.  4. If patient on insulin pump, follow Hypoglycemia Protocol.  If patient requires more than one treatment have patient place pump in SUSPEND and notify MD.  DO NOT leave pump in SUSPEND for greater than 30 minutes unless ordered by MD.  A. Treatment for Mild or Moderate-Patient cooperative and able to swallow    1.  Patient taking PO's and can cooperate   a.  Give one of the following 15 gram CHO options:                           w     1 tube oral dextrose gel                           w     3-4 Glucose tablets                           w     4 oz. Juice                           w     4 oz. regular soda                                    ESRD patients:  clear, regular soda                           w     8 oz. skim milk    b.  Recheck CBG in 15 minutes after treatment                            w       If CBG < 70 mg/dl, repeat treatment and recheck until hypoglycemia is resolved                            w       If CBG > 70 mg/dl and next meal is more than 1 hour away, give additional 15 grams CHO   2.  Patient NPO-Patient cooperative and no altered mental status    a.  Give 25 ml of D50 IV.   b.  Recheck CBG in 15 minutes after treatment.                             w  If CBG is less than 70 mg/dl, repeat treatment and recheck until hypoglycemia is resolved.   c.  Notify MD for further orders.             SPECIAL CONSIDERATIONS:    a.  If no IV access,                              w        Start IV of D5W at [facility]                             w        Give 25 ml of D50 IV.    b.  If unable to gain IV access                             w          Give Glucagon IM:     i.  1 mg if patient weighs more than 45.5 kg     ii.  0.5 mg if patient weighs less than 45.5 kg   c.  Notify MD for further orders  B. Treatment for Severe-- Patient unconscious or unable to take PO's safely    1.  Position patient on side   2.  Give 50 ml D50 IV   3.  Recheck CBG in 15 minutes.                    w      If CBG is less than 70 mg/dl, repeat treatment and recheck until hypoglycemia is resolved.   4.  Notify MD for further orders.    SPECIAL CONSIDERATIONS:    a.  If no IV access                              w     Give Glucagon IM                                              i.  1 mg if patient weighs more than 45.5 kg                                             ii.  0.5 mg if patient weighs less than 45.5 kg                              w      Start IV of D5W at 50 ml/hr and give 50 ml D50 IV   b.  If no IV access and active seizure                               w       Call Rapid Response   c.  If unable to gain IV access, give Glucagon IM:  w          1 mg if patient weighs more than 45.5 kg                              w          0.5 mg if patient weighs less than 45.5 kg   d.  Notify MD for further orders.  C. Complete smart text progress note to document intervention and follow-up CBG   1. In [facility] patient chart, click on Notes (left side of screen)   2. Create Progress Note   3. Click on [employer].  In the Match box type "hypo" and enter    4. Double click on [facility] IP HYPOGLYCEMIC EVENT and enter data   5. MD must  be notified if patient is NPO or experienced severe hypoglycemia

## 2014-02-16 DEATH — deceased

## 2014-10-01 ENCOUNTER — Encounter (HOSPITAL_COMMUNITY): Payer: Self-pay | Admitting: Gastroenterology

## 2015-02-17 IMAGING — CR DG CHEST 1V
1 series · 1 of 1 positions shown · non-contrast
Comparison: DG CHEST 1V PORT dated 01/29/2014

CLINICAL DATA: Central line placement

EXAM:
CHEST - 1 VIEW

[AP]
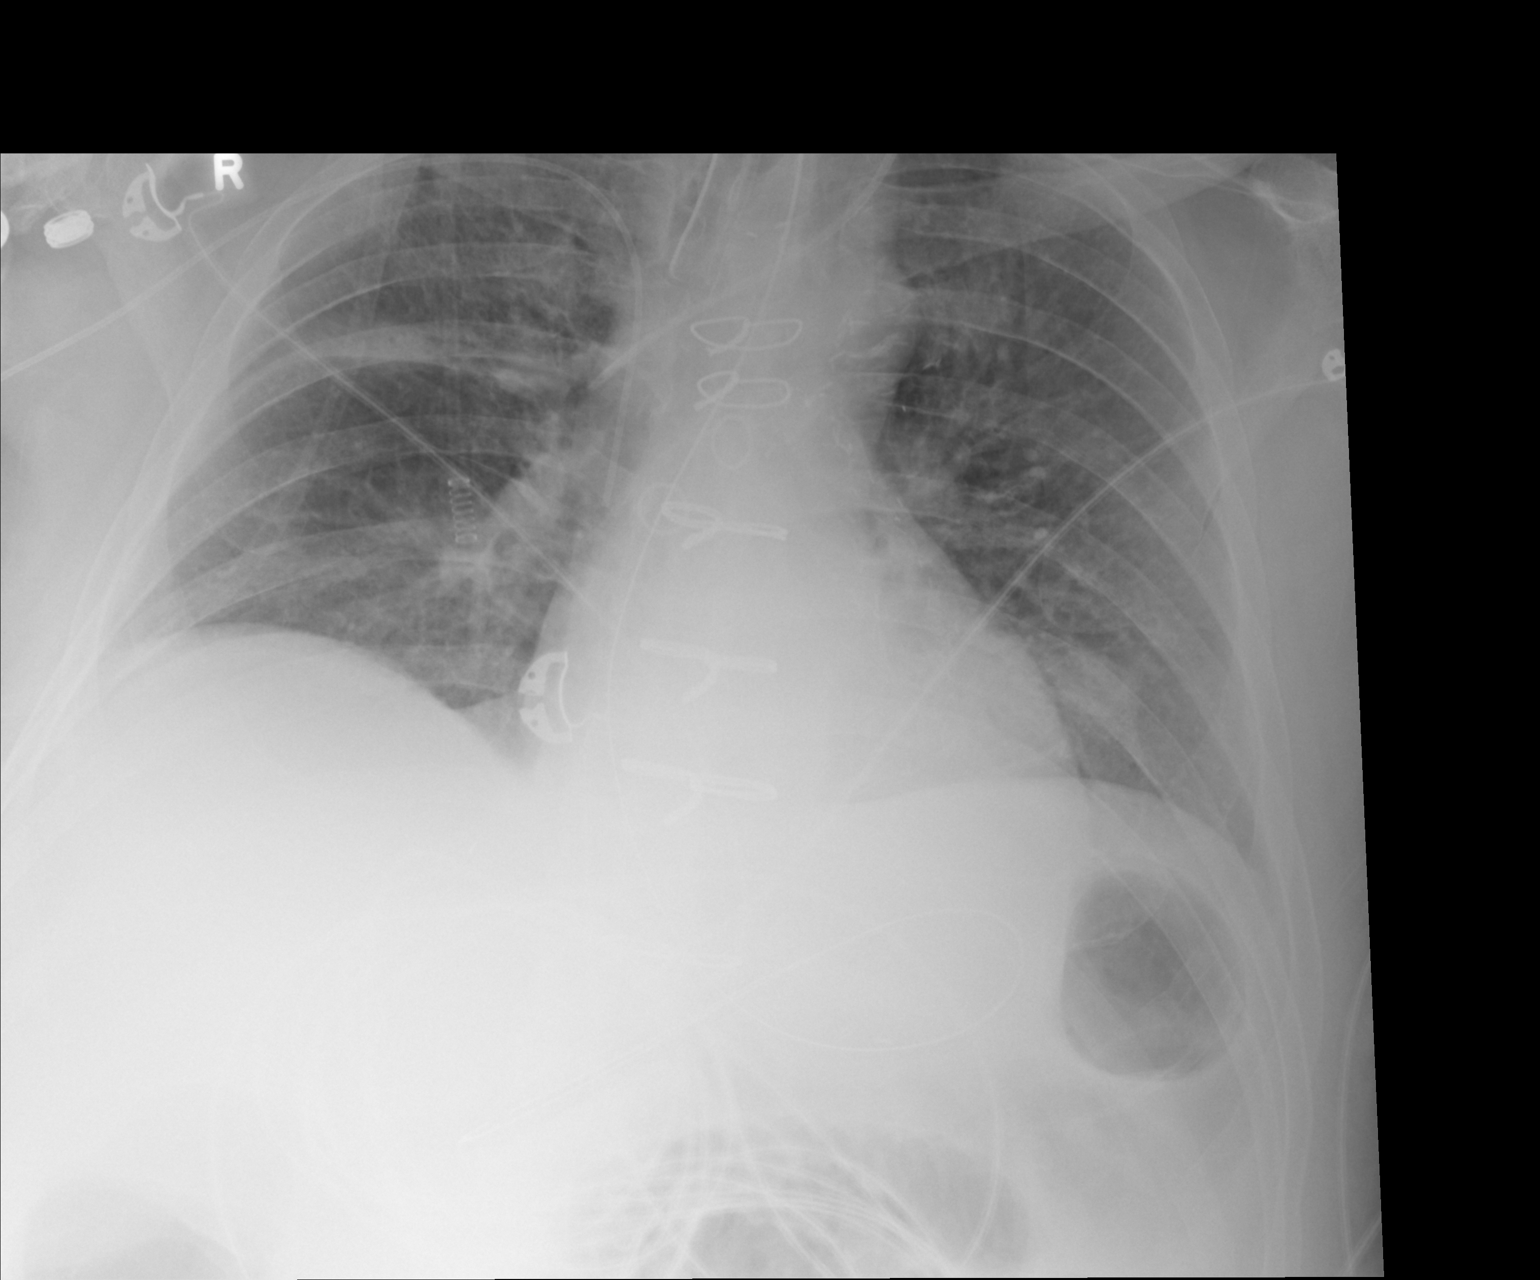

[1 of 1 positions shown; findings below may reference images not displayed]

FINDINGS: Endotracheal tube NG tube and right central line unchanged. Addition
of left internal jugular central line with tip over the superior
vena cava in the region of the azygos arch. Tip is about 3 cm
proximal to the cavoatrial junction. There is no pneumothorax.

Bilateral patchy opacities and interstitial prominence are stable.
IMPRESSION: New left central line as described above

## 2015-02-17 IMAGING — US US ART/VEN ABD/PELV/SCROTUM DOPPLER LTD
1 series · 14 of 22 positions shown · non-contrast
Comparison: 01/23/2014

CLINICAL DATA: Recurrent bleeding post TIPS.

EXAM:
DUPLEX ULTRASOUND OF LIVER AND TIPS SHUNT
TECHNIQUE: Color and duplex Doppler ultrasound was performed to evaluate the
hepatic in-flow and out-flow vessels.

[Series 1: us art/ven abd/pelv/scrotum doppler ltd · 0.27mm/px · 14 of 22 slices shown]
[im 1/22]
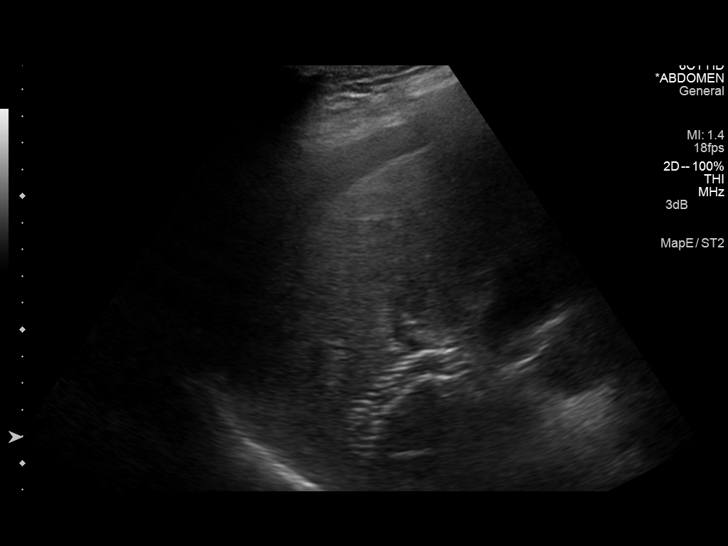
[im 3/22]
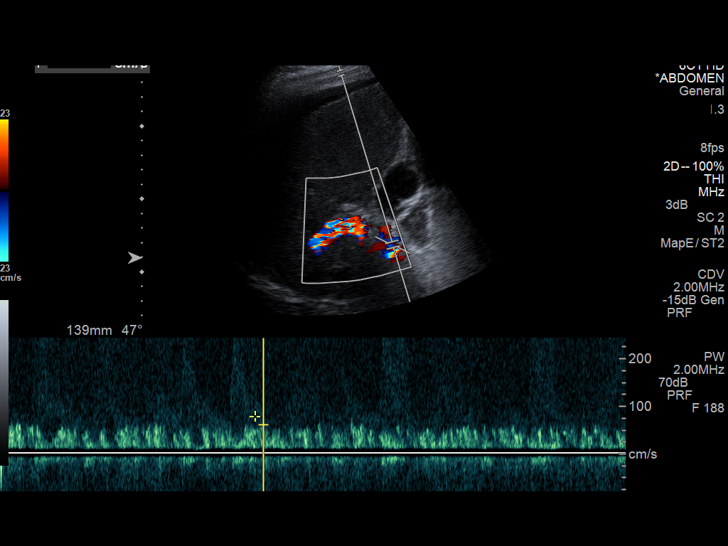
[im 4/22]
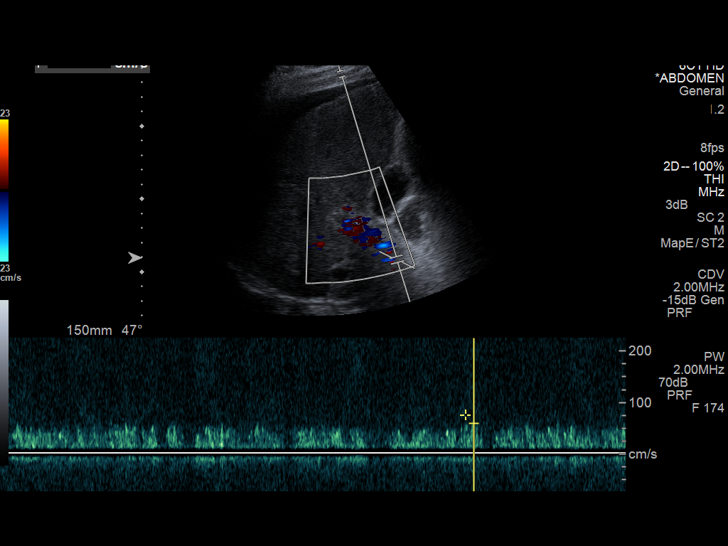
[im 6/22]
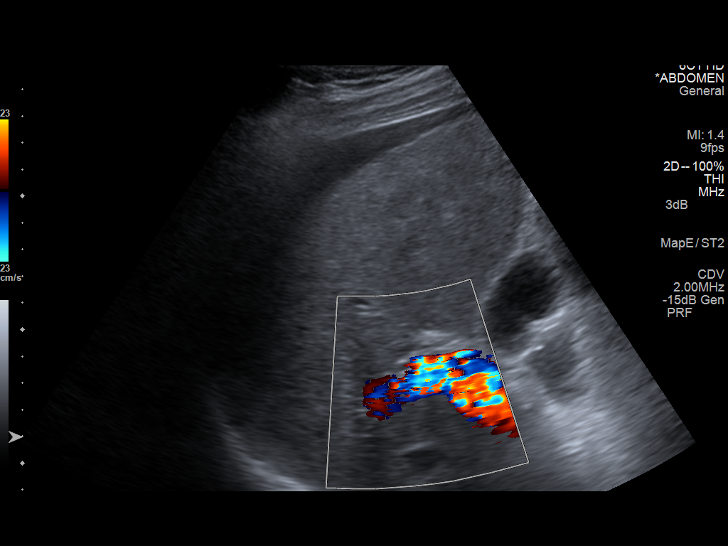
[im 8/22]
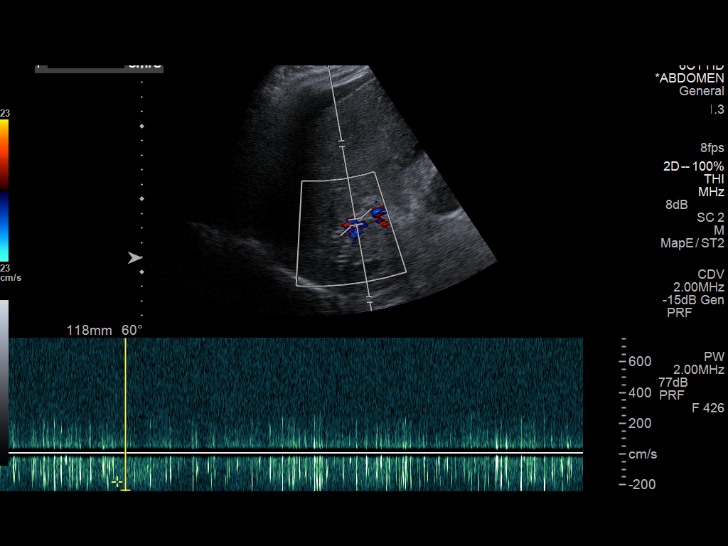
[im 9/22]
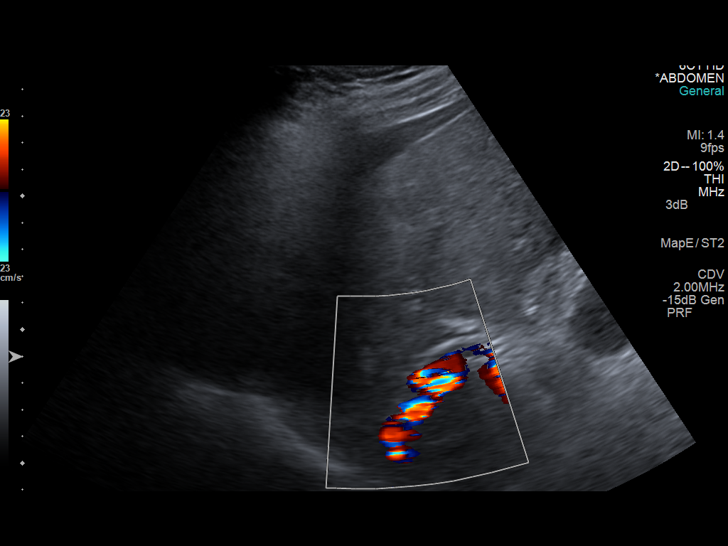
[im 11/22]
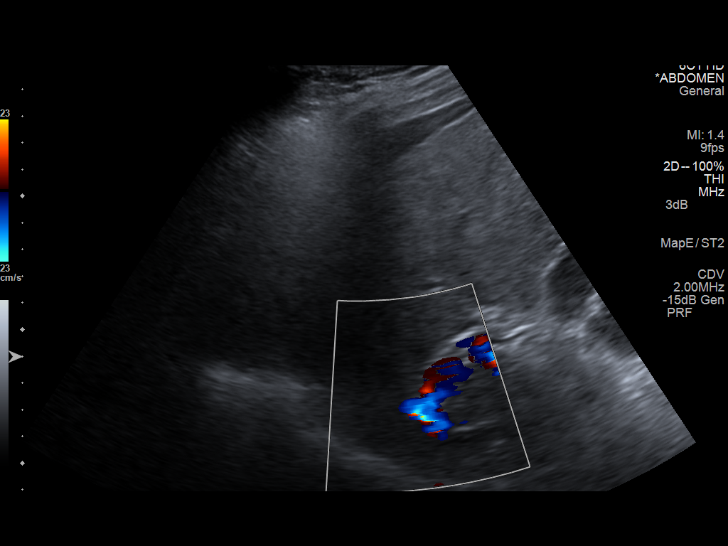
[im 12/22]
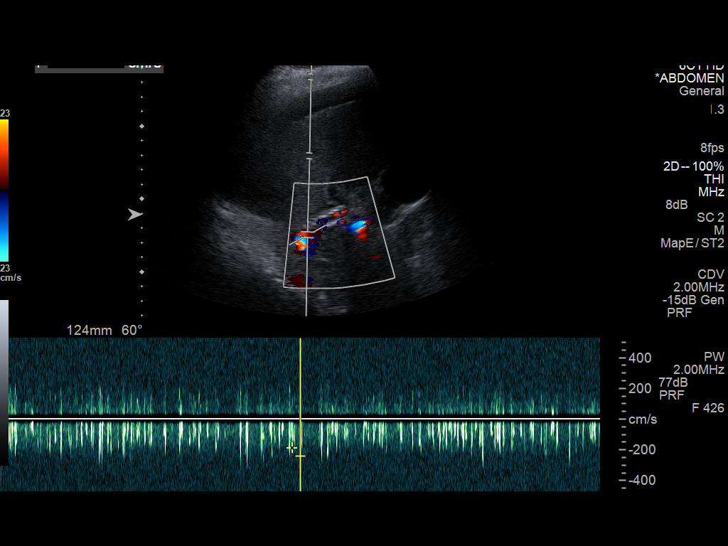
[im 14/22]
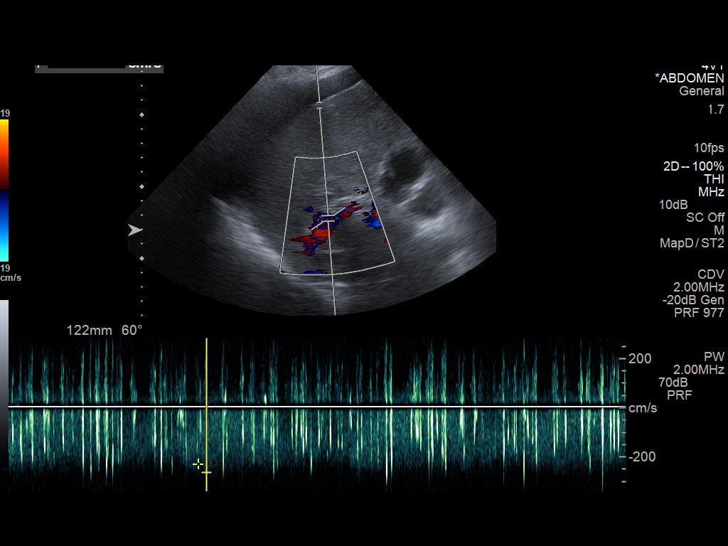
[im 15/22]
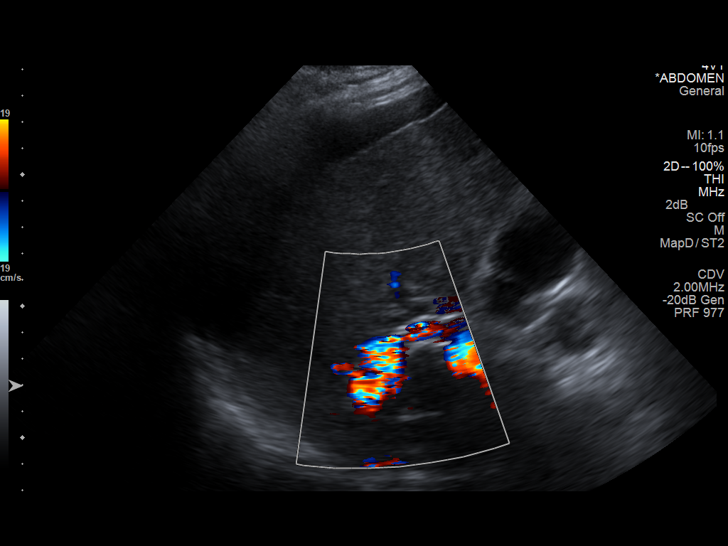
[im 17/22]
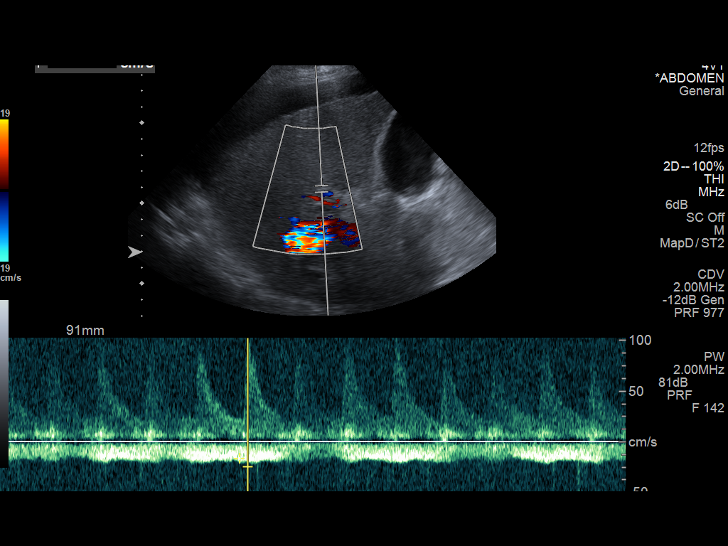
[im 19/22]
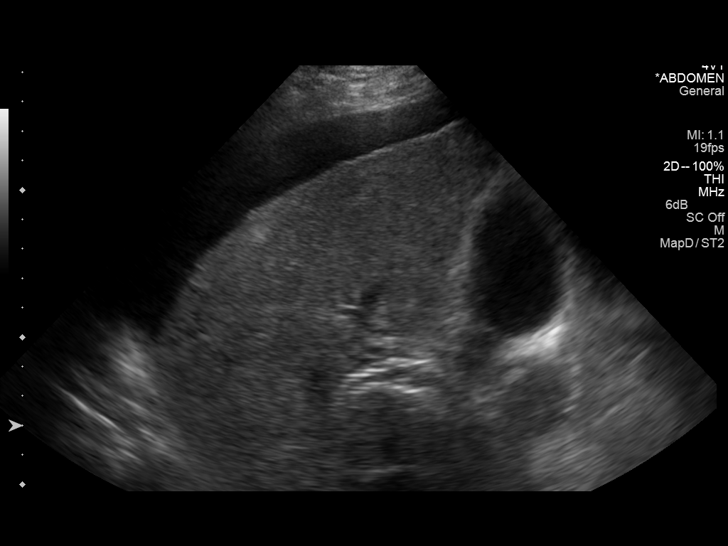
[im 20/22]
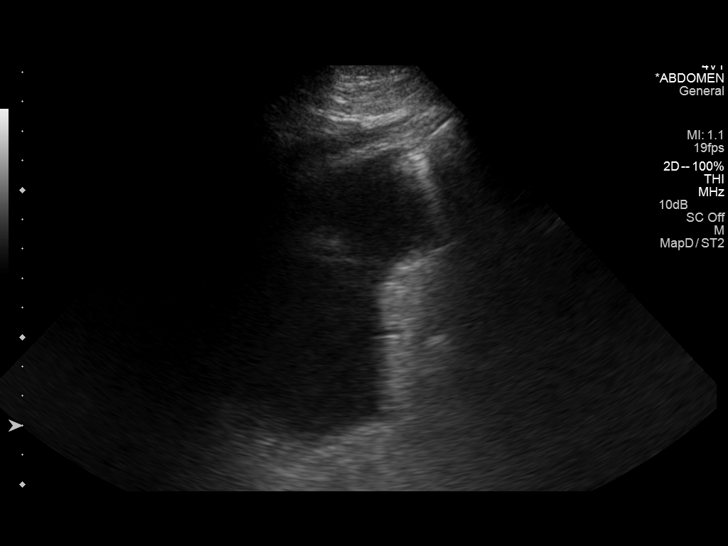
[im 22/22]
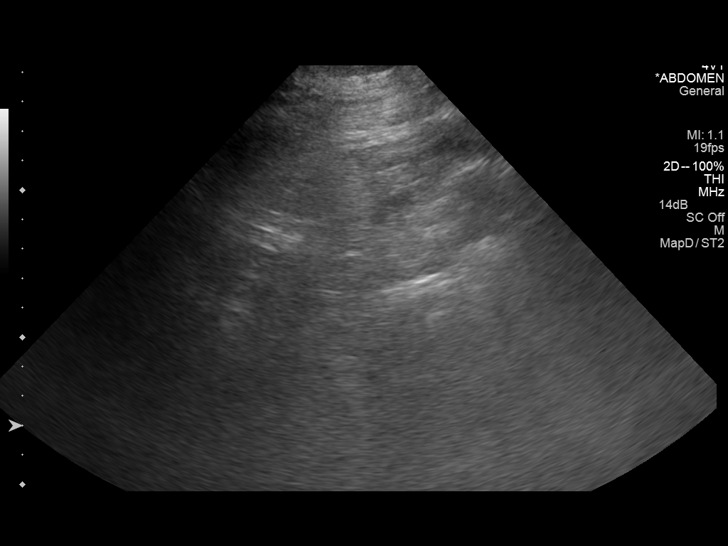

[14 of 22 positions shown; findings below may reference images not displayed]

FINDINGS: TIPS Shunt Velocities

Proximal:  236 cm/sec

Mid:  2 63

Distal:  240 cm/sec

Portal Vein Velocities

Main:  Hepatopetal, 61 cm/sec

Right:  Not evaluated

Left:  Hepatofugal, 24 cm/sec

Varices:  None identified

Ascites: Mild recurrent ascites identified.

Other findings: None
IMPRESSION: 1. Patent TIPS with consistent velocities throughout, no evidence of
stenosis or occlusion, and persistent reversal of intrahepatic left
portal vein flow.
2. Mild recurrent abdominal ascites.

## 2015-02-17 IMAGING — DX DG CHEST 1V PORT
1 series · 1 of 1 positions shown · non-contrast
Comparison: DG CHEST 1V PORT dated 01/28/2014;

CLINICAL DATA: Evaluate endotracheal tube positioning

EXAM:
PORTABLE CHEST - 1 VIEW

[portable]
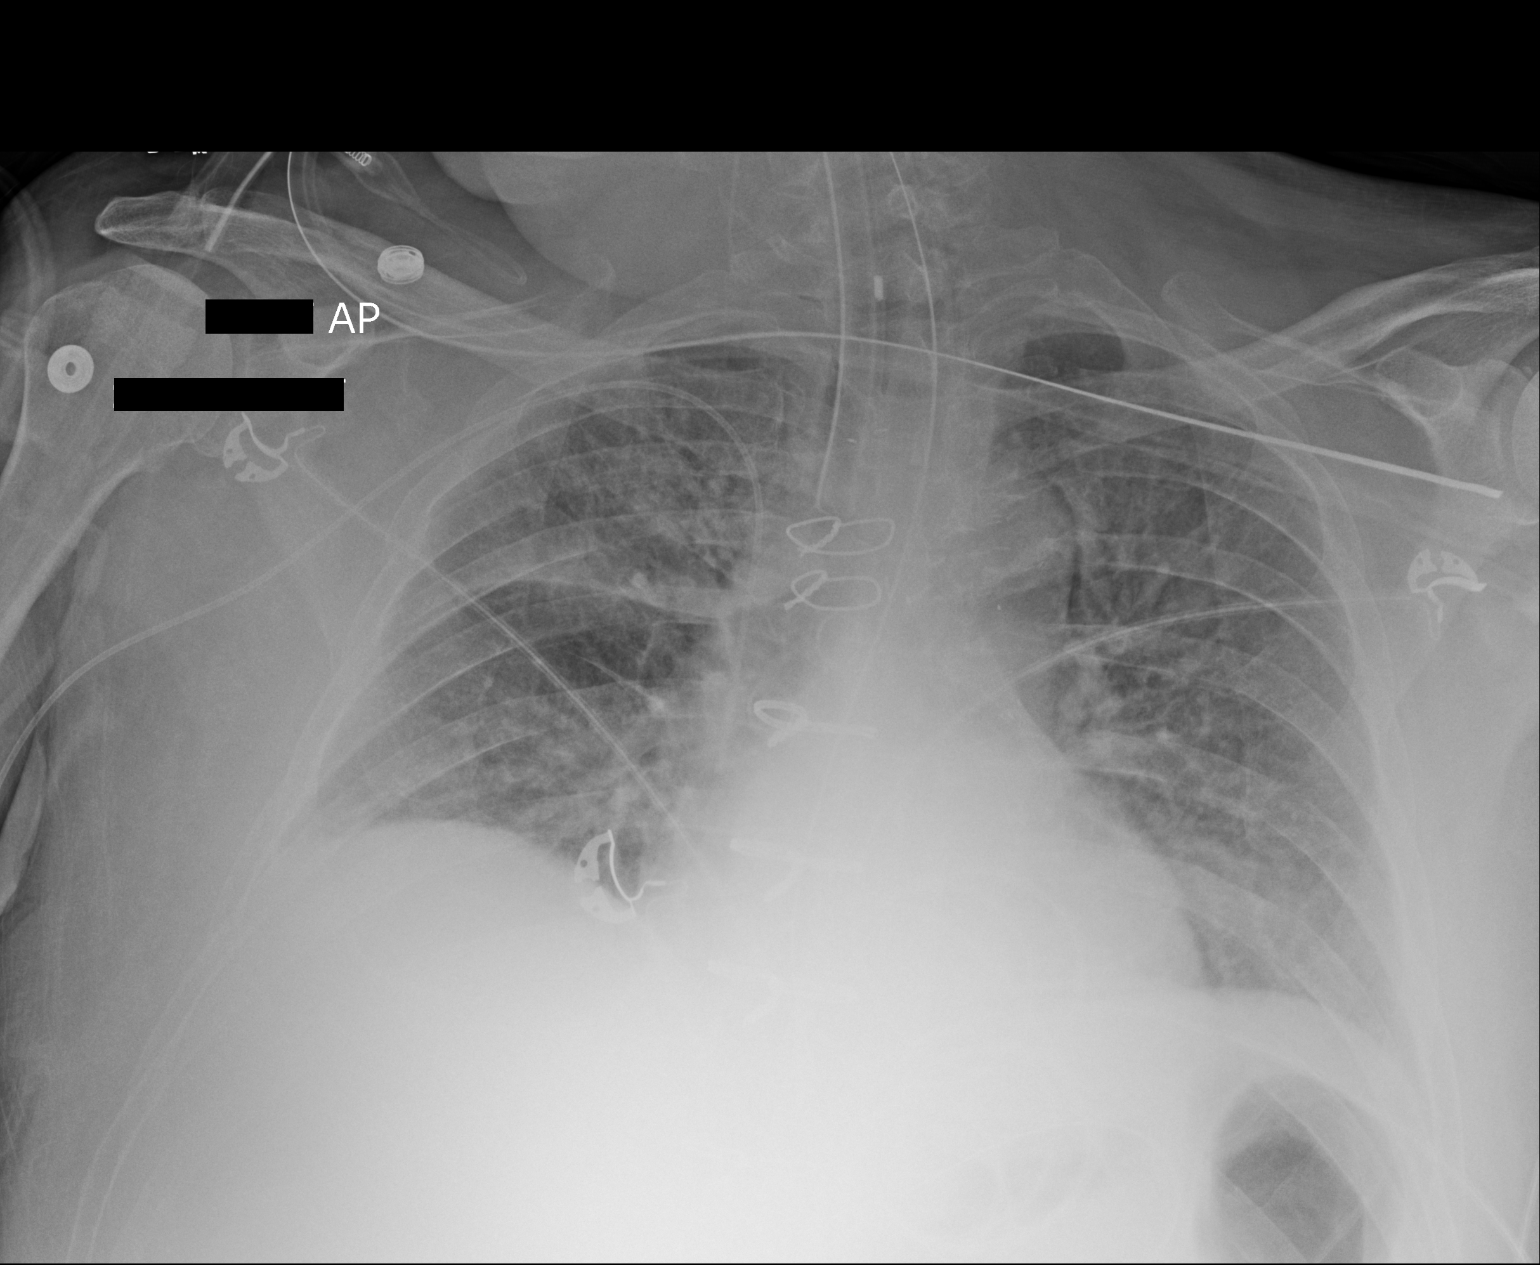

[1 of 1 positions shown; findings below may reference images not displayed]

DG CHEST 1V PORT
dated 01/27/2014; DG CHEST 1V PORT dated 01/25/2014; DG CHEST PORT
[id number] DAY dated 01/23/2014
FINDINGS: Grossly unchanged cardiac silhouette and mediastinal contours given
persistently reduced lung volumes patient rotation. Post median
sternotomy and CABG. Stable position of support apparatus. The
pulmonary vasculature is indistinct with cephalization of flow.
Worsening perihilar and bilateral medial basilar heterogeneous
opacities, left greater than right. Trace bilateral effusions
suspected. No pneumothorax. Unchanged bones.
IMPRESSION: 1.  Stable positioning of support apparatus.  No pneumothorax.
2. Findings suggestive of worsening pulmonary edema and
perihilar/bibasilar atelectasis, left greater than right.
